# Patient Record
Sex: Female | Born: 1990 | Race: White | Hispanic: No | Smoking: Former smoker
Health system: Southern US, Community
[De-identification: ages and names within clinical notes are randomized; demographics above are authoritative.]

## PROBLEM LIST (undated history)

## (undated) DIAGNOSIS — F419 Anxiety disorder, unspecified: Secondary | ICD-10-CM

## (undated) DIAGNOSIS — Z789 Other specified health status: Secondary | ICD-10-CM

## (undated) DIAGNOSIS — F99 Mental disorder, not otherwise specified: Secondary | ICD-10-CM

## (undated) DIAGNOSIS — Z3490 Encounter for supervision of normal pregnancy, unspecified, unspecified trimester: Principal | ICD-10-CM

## (undated) DIAGNOSIS — O219 Vomiting of pregnancy, unspecified: Secondary | ICD-10-CM

## (undated) HISTORY — PX: WISDOM TOOTH EXTRACTION: SHX21

## (undated) HISTORY — DX: Vomiting of pregnancy, unspecified: O21.9

## (undated) HISTORY — DX: Mental disorder, not otherwise specified: F99

## (undated) HISTORY — DX: Encounter for supervision of normal pregnancy, unspecified, unspecified trimester: Z34.90

---

## 2009-08-27 ENCOUNTER — Ambulatory Visit (HOSPITAL_COMMUNITY): Admission: RE | Admit: 2009-08-27 | Discharge: 2009-08-27 | Payer: Self-pay | Admitting: Obstetrics & Gynecology

## 2010-03-16 ENCOUNTER — Inpatient Hospital Stay (HOSPITAL_COMMUNITY): Admission: AD | Admit: 2010-03-16 | Discharge: 2010-03-16 | Payer: Self-pay | Admitting: Obstetrics and Gynecology

## 2010-03-16 ENCOUNTER — Ambulatory Visit: Payer: Self-pay | Admitting: Obstetrics and Gynecology

## 2010-03-18 ENCOUNTER — Ambulatory Visit: Payer: Self-pay | Admitting: Obstetrics and Gynecology

## 2010-07-15 ENCOUNTER — Inpatient Hospital Stay (HOSPITAL_COMMUNITY): Admission: AD | Admit: 2010-07-15 | Discharge: 2010-03-20 | Payer: Self-pay | Admitting: Obstetrics & Gynecology

## 2010-10-22 LAB — CBC
HCT: 37.6 % (ref 36.0–46.0)
MCV: 87.7 fL (ref 78.0–100.0)
RDW: 14.1 % (ref 11.5–15.5)
WBC: 10.8 10*3/uL — ABNORMAL HIGH (ref 4.0–10.5)

## 2010-10-22 LAB — RPR: RPR Ser Ql: NONREACTIVE

## 2011-01-24 ENCOUNTER — Other Ambulatory Visit: Payer: Self-pay | Admitting: Adult Health

## 2011-01-24 ENCOUNTER — Other Ambulatory Visit (HOSPITAL_COMMUNITY)
Admission: RE | Admit: 2011-01-24 | Discharge: 2011-01-24 | Disposition: A | Discharge status: Home / Self Care | Payer: Medicaid Other | Source: Ambulatory Visit | Attending: Obstetrics and Gynecology | Admitting: Obstetrics and Gynecology

## 2011-01-24 DIAGNOSIS — Z01419 Encounter for gynecological examination (general) (routine) without abnormal findings: Secondary | ICD-10-CM | POA: Insufficient documentation

## 2011-01-24 DIAGNOSIS — Z113 Encounter for screening for infections with a predominantly sexual mode of transmission: Secondary | ICD-10-CM | POA: Insufficient documentation

## 2012-02-20 ENCOUNTER — Other Ambulatory Visit (HOSPITAL_COMMUNITY)
Admission: RE | Admit: 2012-02-20 | Discharge: 2012-02-20 | Disposition: A | Discharge status: Home / Self Care | Payer: Medicaid Other | Source: Ambulatory Visit | Attending: Obstetrics and Gynecology | Admitting: Obstetrics and Gynecology

## 2012-02-20 ENCOUNTER — Other Ambulatory Visit: Payer: Self-pay | Admitting: Obstetrics and Gynecology

## 2012-02-20 DIAGNOSIS — Z01419 Encounter for gynecological examination (general) (routine) without abnormal findings: Secondary | ICD-10-CM | POA: Insufficient documentation

## 2014-08-08 NOTE — L&D Delivery Note (Cosign Needed)
Delivery Note At 10:03 AM a viable female was delivered via Vaginal, Spontaneous Delivery (Presentation: Left Occiput Anterior).  APGAR: 9, 9; weight 8 lb 7.3 oz (3835 g).   Placenta status: Intact, Expressed.  Cord: 3 vessels with the following complications: None.  Anesthesia: None  Episiotomy: None Lacerations: None Est. Blood Loss (mL): 100  Mom to postpartum.  Baby to Couplet care / Skin to Skin.  Lowanda Foster 03/04/2015, 11:55 AM

## 2014-08-18 ENCOUNTER — Encounter: Payer: Self-pay | Admitting: Adult Health

## 2014-08-18 ENCOUNTER — Ambulatory Visit (INDEPENDENT_AMBULATORY_CARE_PROVIDER_SITE_OTHER): Payer: BLUE CROSS/BLUE SHIELD | Admitting: Adult Health

## 2014-08-18 ENCOUNTER — Telehealth: Payer: Self-pay | Admitting: Adult Health

## 2014-08-18 VITALS — BP 120/68 | Ht 66.0 in | Wt 168.5 lb

## 2014-08-18 DIAGNOSIS — O219 Vomiting of pregnancy, unspecified: Secondary | ICD-10-CM | POA: Insufficient documentation

## 2014-08-18 DIAGNOSIS — Z3490 Encounter for supervision of normal pregnancy, unspecified, unspecified trimester: Secondary | ICD-10-CM

## 2014-08-18 DIAGNOSIS — Z3201 Encounter for pregnancy test, result positive: Secondary | ICD-10-CM

## 2014-08-18 HISTORY — DX: Vomiting of pregnancy, unspecified: O21.9

## 2014-08-18 HISTORY — DX: Encounter for supervision of normal pregnancy, unspecified, unspecified trimester: Z34.90

## 2014-08-18 LAB — POCT URINE PREGNANCY: Preg Test, Ur: POSITIVE

## 2014-08-18 MED ORDER — PROMETHAZINE HCL 25 MG PO TABS: 25.0000 mg | ORAL_TABLET | Freq: Four times a day (QID) | ORAL | Status: DC | PRN

## 2014-08-18 MED ORDER — DOXYLAMINE-PYRIDOXINE 10-10 MG PO TBEC: DELAYED_RELEASE_TABLET | ORAL | Status: DC

## 2014-08-18 NOTE — Progress Notes (Signed)
Subjective:     Patient ID: Delphia GratesShelby D Celia, female   DOB: 08/11/1990, 24 y.o.   MRN: [id number]  HPI Mitzi DavenportShelby is a [age] year old white female in for UPT.Has N/V.  Review of Systems See HPI Reviewed past medical,surgical, social and family history. Reviewed medications and allergies.     Objective:   Physical Exam BP 120/68 mmHg  Ht 5\' 6"  (1.676 m)  Wt 168 lb 8 oz (76.431 kg)  BMI 27.21 kg/m2  LMP 11/02/2015UPT +, about [redacted] weeks pregnant with EDD 03/17/15, eat every 2 hours, will try diclegis    Assessment:     Pregnant +UPT Nausea and vomiting    Plan:     Rx diclegis #100 with 1 refill, take as directed Return 1/21 for dating US Review handout on first trimester and N/V and diclegis

## 2014-08-18 NOTE — Patient Instructions (Signed)
Morning Sickness Morning sickness is when you feel sick to your stomach (nauseous) during pregnancy. This nauseous feeling may or may not come with vomiting. It often occurs in the morning but can be a problem any time of day. Morning sickness is most common during the first trimester, but it may continue throughout pregnancy. While morning sickness is unpleasant, it is usually harmless unless you develop severe and continual vomiting (hyperemesis gravidarum). This condition requires more intense treatment.  CAUSES  The cause of morning sickness is not completely known but seems to be related to normal hormonal changes that occur in pregnancy. RISK FACTORS You are at greater risk if you: Experienced nausea or vomiting before your pregnancy. Had morning sickness during a previous pregnancy. Are pregnant with more than one baby, such as twins. TREATMENT  Do not use any medicines (prescription, over-the-counter, or herbal) for morning sickness without first talking to your health care provider. Your health care provider may prescribe or recommend: Vitamin B6 supplements. Anti-nausea medicines. The herbal medicine ginger. HOME CARE INSTRUCTIONS  Only take over-the-counter or prescription medicines as directed by your health care provider. Taking multivitamins before getting pregnant can prevent or decrease the severity of morning sickness in most women. Eat a piece of dry toast or unsalted crackers before getting out of bed in the morning. Eat five or six small meals a day. Eat dry and bland foods (rice, baked potato). Foods high in carbohydrates are often helpful. Do not drink liquids with your meals. Drink liquids between meals. Avoid greasy, fatty, and spicy foods. Get someone to cook for you if the smell of any food causes nausea and vomiting. If you feel nauseous after taking prenatal vitamins, take the vitamins at night or with a snack. Snack on protein foods (nuts, yogurt, cheese) between  meals if you are hungry. Eat unsweetened gelatins for desserts. Wearing an acupressure wristband (worn for sea sickness) may be helpful. Acupuncture may be helpful. Do not smoke. Get a humidifier to keep the air in your house free of odors. Get plenty of fresh air. SEEK MEDICAL CARE IF:  Your home remedies are not working, and you need medicine. You feel dizzy or lightheaded. You are losing weight. SEEK IMMEDIATE MEDICAL CARE IF:  You have persistent and uncontrolled nausea and vomiting. You pass out (faint). MAKE SURE YOU: Understand these instructions. Will watch your condition. Will get help right away if you are not doing well or get worse. Document Released: 09/15/2006 Document Revised: 07/30/2013 Document Reviewed: 01/09/2013 [facility] Patient Information 2015 [place], [place]. This information is not intended to replace advice given to you by your health care provider. Make sure you discuss any questions you have with your health care provider. First Trimester of Pregnancy The first trimester of pregnancy is from week 1 until the end of week 12 (months 1 through 3). A week after a sperm fertilizes an egg, the egg will implant on the wall of the uterus. This embryo will begin to develop into a baby. Genes from you and your partner are forming the baby. The female genes determine whether the baby is a boy or a girl. At 6-8 weeks, the eyes and face are formed, and the heartbeat can be seen on ultrasound. At the end of 12 weeks, all the baby's organs are formed.  Now that you are pregnant, you will want to do everything you can to have a healthy baby. Two of the most important things are to get good prenatal care and to  follow your health care provider's instructions. Prenatal care is all the medical care you receive before the baby's birth. This care will help prevent, find, and treat any problems during the pregnancy and childbirth. BODY CHANGES Your body goes through many changes during  pregnancy. The changes vary from woman to woman.   You may gain or lose a couple of pounds at first.  You may feel sick to your stomach (nauseous) and throw up (vomit). If the vomiting is uncontrollable, call your health care provider.  You may tire easily.  You may develop headaches that can be relieved by medicines approved by your health care provider.  You may urinate more often. Painful urination may mean you have a bladder infection.  You may develop heartburn as a result of your pregnancy.  You may develop constipation because certain hormones are causing the muscles that push waste through your intestines to slow down.  You may develop hemorrhoids or swollen, bulging veins (varicose veins).  Your breasts may begin to grow larger and become tender. Your nipples may stick out more, and the tissue that surrounds them (areola) may become darker.  Your gums may bleed and may be sensitive to brushing and flossing.  Dark spots or blotches (chloasma, mask of pregnancy) may develop on your face. This will likely fade after the baby is born.  Your menstrual periods will stop.  You may have a loss of appetite.  You may develop cravings for certain kinds of food.  You may have changes in your emotions from day to day, such as being excited to be pregnant or being concerned that something may go wrong with the pregnancy and baby.  You may have more vivid and strange dreams.  You may have changes in your hair. These can include thickening of your hair, rapid growth, and changes in texture. Some women also have hair loss during or after pregnancy, or hair that feels dry or thin. Your hair will most likely return to normal after your baby is born. WHAT TO EXPECT AT YOUR PRENATAL VISITS During a routine prenatal visit:  You will be weighed to make sure you and the baby are growing normally.  Your blood pressure will be taken.  Your abdomen will be measured to track your baby's  growth.  The fetal heartbeat will be listened to starting around week 10 or 12 of your pregnancy.  Test results from any previous visits will be discussed. Your health care provider may ask you:  How you are feeling.  If you are feeling the baby move.  If you have had any abnormal symptoms, such as leaking fluid, bleeding, severe headaches, or abdominal cramping.  If you have any questions. Other tests that may be performed during your first trimester include:  Blood tests to find your blood type and to check for the presence of any previous infections. They will also be used to check for low iron levels (anemia) and Rh antibodies. Later in the pregnancy, blood tests for diabetes will be done along with other tests if problems develop.  Urine tests to check for infections, diabetes, or protein in the urine.  An ultrasound to confirm the proper growth and development of the baby.  An amniocentesis to check for possible genetic problems.  Fetal screens for spina bifida and Down syndrome.  You may need other tests to make sure you and the baby are doing well. HOME CARE INSTRUCTIONS  Medicines  Follow your health care provider's instructions regarding medicine  use. Specific medicines may be either safe or unsafe to take during pregnancy.  Take your prenatal vitamins as directed.  If you develop constipation, try taking a stool softener if your health care provider approves. Diet  Eat regular, well-balanced meals. Choose a variety of foods, such as meat or vegetable-based protein, fish, milk and low-fat dairy products, vegetables, fruits, and whole grain breads and cereals. Your health care provider will help you determine the amount of weight gain that is right for you.  Avoid raw meat and uncooked cheese. These carry germs that can cause birth defects in the baby.  Eating four or five small meals rather than three large meals a day may help relieve nausea and vomiting. If you  start to feel nauseous, eating a few soda crackers can be helpful. Drinking liquids between meals instead of during meals also seems to help nausea and vomiting.  If you develop constipation, eat more high-fiber foods, such as fresh vegetables or fruit and whole grains. Drink enough fluids to keep your urine clear or pale yellow. Activity and Exercise  Exercise only as directed by your health care provider. Exercising will help you:  Control your weight.  Stay in shape.  Be prepared for labor and delivery.  Experiencing pain or cramping in the lower abdomen or low back is a good sign that you should stop exercising. Check with your health care provider before continuing normal exercises.  Try to avoid standing for long periods of time. Move your legs often if you must stand in one place for a long time.  Avoid heavy lifting.  Wear low-heeled shoes, and practice good posture.  You may continue to have sex unless your health care provider directs you otherwise. Relief of Pain or Discomfort  Wear a good support bra for breast tenderness.   Take warm sitz baths to soothe any pain or discomfort caused by hemorrhoids. Use hemorrhoid cream if your health care provider approves.   Rest with your legs elevated if you have leg cramps or low back pain.  If you develop varicose veins in your legs, wear support hose. Elevate your feet for 15 minutes, 3-4 times a day. Limit salt in your diet. Prenatal Care  Schedule your prenatal visits by the twelfth week of pregnancy. They are usually scheduled monthly at first, then more often in the last 2 months before delivery.  Write down your questions. Take them to your prenatal visits.  Keep all your prenatal visits as directed by your health care provider. Safety  Wear your seat belt at all times when driving.  Make a list of emergency phone numbers, including numbers for family, friends, the hospital, and police and fire  departments. General Tips  Ask your health care provider for a referral to a local prenatal education class. Begin classes no later than at the beginning of month 6 of your pregnancy.  Ask for help if you have counseling or nutritional needs during pregnancy. Your health care provider can offer advice or refer you to specialists for help with various needs.  Do not use hot tubs, steam rooms, or saunas.  Do not douche or use tampons or scented sanitary pads.  Do not cross your legs for long periods of time.  Avoid cat litter boxes and soil used by cats. These carry germs that can cause birth defects in the baby and possibly loss of the fetus by miscarriage or stillbirth.  Avoid all smoking, herbs, alcohol, and medicines not prescribed by your  health care provider. Chemicals in these affect the formation and growth of the baby.  Schedule a dentist appointment. At home, brush your teeth with a soft toothbrush and be gentle when you floss. SEEK MEDICAL CARE IF:   You have dizziness.  You have mild pelvic cramps, pelvic pressure, or nagging pain in the abdominal area.  You have persistent nausea, vomiting, or diarrhea.  You have a bad smelling vaginal discharge.  You have pain with urination.  You notice increased swelling in your face, hands, legs, or ankles. SEEK IMMEDIATE MEDICAL CARE IF:   You have a fever.  You are leaking fluid from your vagina.  You have spotting or bleeding from your vagina.  You have severe abdominal cramping or pain.  You have rapid weight gain or loss.  You vomit blood or material that looks like coffee grounds.  You are exposed to [place] measles and have never had them.  You are exposed to fifth disease or chickenpox.  You develop a severe headache.  You have shortness of breath.  You have any kind of trauma, such as from a fall or a car accident. Document Released: 07/19/2001 Document Revised: 12/09/2013 Document Reviewed:  06/04/2013 [facility] Patient Information 2015 [place], [place]. This information is not intended to replace advice given to you by your health care provider. Make sure you discuss any questions you have with your health care provider. Return 1/21 for dating [place]

## 2014-08-18 NOTE — Telephone Encounter (Signed)
Spoke with pt. Pt was seen this am and prescribed Diclegis. Pt states it's out of her price range. She is requesting Phenergan tabs. Thanks!! JSY

## 2014-08-18 NOTE — Telephone Encounter (Signed)
Pt requests phenergan will Rx

## 2014-08-28 ENCOUNTER — Ambulatory Visit (INDEPENDENT_AMBULATORY_CARE_PROVIDER_SITE_OTHER): Payer: BLUE CROSS/BLUE SHIELD

## 2014-08-28 DIAGNOSIS — O3680X0 Pregnancy with inconclusive fetal viability, not applicable or unspecified: Secondary | ICD-10-CM

## 2014-08-28 DIAGNOSIS — Z3490 Encounter for supervision of normal pregnancy, unspecified, unspecified trimester: Secondary | ICD-10-CM

## 2014-08-28 NOTE — Progress Notes (Signed)
Early OB ultrasound performed today.  Single, active IUP with FHR 158 bpm.  CRL measures 7.45 cm (13+[redacted] weeks GA) with a EDD of 03-02-15.  Right ovary is within normal limits.  Left ovary not visualized today. Cervix appears closed.

## 2014-09-11 ENCOUNTER — Ambulatory Visit (INDEPENDENT_AMBULATORY_CARE_PROVIDER_SITE_OTHER): Payer: BLUE CROSS/BLUE SHIELD | Admitting: Advanced Practice Midwife

## 2014-09-11 ENCOUNTER — Other Ambulatory Visit (HOSPITAL_COMMUNITY)
Admission: RE | Admit: 2014-09-11 | Discharge: 2014-09-11 | Disposition: A | Discharge status: Home / Self Care | Payer: BLUE CROSS/BLUE SHIELD | Source: Ambulatory Visit | Attending: Advanced Practice Midwife | Admitting: Advanced Practice Midwife

## 2014-09-11 ENCOUNTER — Encounter: Payer: Self-pay | Admitting: Advanced Practice Midwife

## 2014-09-11 VITALS — BP 112/58 | Wt 170.0 lb

## 2014-09-11 DIAGNOSIS — Z114 Encounter for screening for human immunodeficiency virus [HIV]: Secondary | ICD-10-CM

## 2014-09-11 DIAGNOSIS — Z3482 Encounter for supervision of other normal pregnancy, second trimester: Secondary | ICD-10-CM

## 2014-09-11 DIAGNOSIS — Z124 Encounter for screening for malignant neoplasm of cervix: Secondary | ICD-10-CM

## 2014-09-11 DIAGNOSIS — Z363 Encounter for antenatal screening for malformations: Secondary | ICD-10-CM

## 2014-09-11 DIAGNOSIS — Z113 Encounter for screening for infections with a predominantly sexual mode of transmission: Secondary | ICD-10-CM

## 2014-09-11 DIAGNOSIS — Z1389 Encounter for screening for other disorder: Secondary | ICD-10-CM

## 2014-09-11 DIAGNOSIS — Z3490 Encounter for supervision of normal pregnancy, unspecified, unspecified trimester: Secondary | ICD-10-CM

## 2014-09-11 DIAGNOSIS — Z01419 Encounter for gynecological examination (general) (routine) without abnormal findings: Secondary | ICD-10-CM | POA: Diagnosis present

## 2014-09-11 DIAGNOSIS — Z331 Pregnant state, incidental: Secondary | ICD-10-CM

## 2014-09-11 DIAGNOSIS — Z0283 Encounter for blood-alcohol and blood-drug test: Secondary | ICD-10-CM

## 2014-09-11 DIAGNOSIS — Z1371 Encounter for nonprocreative screening for genetic disease carrier status: Secondary | ICD-10-CM

## 2014-09-11 DIAGNOSIS — Z0184 Encounter for antibody response examination: Secondary | ICD-10-CM

## 2014-09-11 LAB — POCT URINALYSIS DIPSTICK
GLUCOSE UA: NEGATIVE
Ketones, UA: NEGATIVE
LEUKOCYTES UA: NEGATIVE
NITRITE UA: NEGATIVE
RBC UA: NEGATIVE

## 2014-09-11 MED ORDER — BUTALBITAL-APAP-CAFFEINE 50-325-40 MG PO TABS: 1.0000 | ORAL_TABLET | Freq: Four times a day (QID) | ORAL | Status: DC | PRN

## 2014-09-11 NOTE — Progress Notes (Signed)
  Subjective:    Emily Hatfield is a [id number] 4360w3d being seen today for her first obstetrical visit.  Her obstetrical history is significant for nothing.  Pregnancy history fully reviewed.  Patient reports nausea, bloated and some gas.  Declines meds.  Had a nosebleed Saturday.    Filed Vitals:   09/11/14 1356  BP: 112/58  Weight: 170 lb (77.111 kg)    HISTORY: OB History  Gravida Para Term Preterm AB SAB TAB Ectopic Multiple Living  3 1 1  1 1    1     # Outcome Date GA Lbr Len/2nd Weight Sex Delivery Anes PTL Lv  3 Current           2 Term 03/18/10 206w0d  7 lb 11 oz (3.487 kg) F Vag-Spont   Y  1 SAB              Past Medical History  Diagnosis Date  . Pregnant 08/18/2014  . Nausea and vomiting during pregnancy prior to [redacted] weeks gestation 08/18/2014   No past surgical history on file. Family History  Problem Relation Age of Onset  . Stroke Paternal Grandmother   . Heart disease Maternal Grandmother   . Cancer Maternal Grandfather     lung  . Hypertension Mother   . Diabetes Mother     borderline     Exam       Pelvic Exam:    Perineum: Normal Perineum   Vulva: normal   Vagina:  normal mucosa, normal discharge, no palpable nodules   Uterus Normal, Gravid, FH: 15     Cervix: normal   Adnexa: Not palpable   Urinary:  urethral meatus normal    System: Breast:  normal appearance, no masses or tenderness   Skin: normal coloration and turgor, no rashes    Neurologic: oriented, normal, normal mood   Extremities: normal strength, tone, and muscle mass   HEENT PERRLA   Mouth/Teeth mucous membranes moist, normal dentition   Neck supple and no masses   Cardiovascular: regular rate and rhythm   Respiratory:  appears well, vitals normal, no respiratory distress, acyanotic   Abdomen: soft, non-tender;  FHR: 150          Assessment:    Pregnancy: [id number] Patient Active Problem List   Diagnosis Date Noted  . Pregnant 08/18/2014  . Nausea and vomiting during  pregnancy prior to [redacted] weeks gestation 08/18/2014        Plan:     Initial labs drawn. Continue prenatal vitamins  Problem list reviewed and updated  Saline spray for nosebleed Reviewed n/v relief measures and warning s/s to report  Reviewed recommended weight gain based on pre-gravid BMI  Encouraged well-balanced diet Genetic Screening discussed Quad Screen: declined.  Ultrasound discussed; fetal survey: requested.  Follow up in 4 weeks for anatomy scan.  CRESENZO-DISHMAN,Teresina Bugaj 09/11/2014

## 2014-09-13 LAB — URINE CULTURE: ORGANISM ID, BACTERIA: NO GROWTH

## 2014-09-16 LAB — RUBELLA SCREEN: Rubella Antibodies, IGG: 2.98 index (ref >0.99)

## 2014-09-16 LAB — CBC
HCT: 37.9 % (ref 34.0–46.6)
HEMOGLOBIN: 12.8 g/dL (ref 11.1–15.9)
MCH: 29.4 pg (ref 26.6–33.0)
MCHC: 33.8 g/dL (ref 31.5–35.7)
MCV: 87 fL (ref 79–97)
Platelets: 223 10*3/uL (ref 150–379)
RBC: 4.35 x10E6/uL (ref 3.77–5.28)
RDW: 13.9 % (ref 12.3–15.4)
WBC: 8.4 10*3/uL (ref 3.4–10.8)

## 2014-09-16 LAB — URINALYSIS, ROUTINE W REFLEX MICROSCOPIC
Bilirubin, UA: NEGATIVE
GLUCOSE, UA: NEGATIVE
LEUKOCYTES UA: NEGATIVE
Nitrite, UA: NEGATIVE
RBC UA: NEGATIVE
SPEC GRAV UA: 1.027 (ref 1.005–1.030)
Urobilinogen, Ur: 1 mg/dL (ref 0.2–1.0)
pH, UA: 6.5 (ref 5.0–7.5)

## 2014-09-16 LAB — PMP SCREEN PROFILE (10S), URINE
Amphetamine Screen, Ur: NEGATIVE ng/mL
BARBITURATE SCRN UR: NEGATIVE ng/mL
BENZODIAZEPINE SCREEN, URINE: NEGATIVE ng/mL
CANNABINOIDS UR QL SCN: NEGATIVE ng/mL
CREATININE(CRT), U: 196.6 mg/dL (ref 20.0–300.0)
Cocaine(Metab.)Screen, Urine: NEGATIVE ng/mL
Methadone Scn, Ur: NEGATIVE ng/mL
OPIATE SCRN UR: NEGATIVE ng/mL
OXYCODONE+OXYMORPHONE UR QL SCN: NEGATIVE ng/mL
PCP SCRN UR: NEGATIVE ng/mL
PH UR, DRUG SCRN: 6.2 (ref 4.5–8.9)
PROPOXYPHENE SCREEN: NEGATIVE ng/mL

## 2014-09-16 LAB — ANTIBODY SCREEN: ANTIBODY SCREEN: NEGATIVE

## 2014-09-16 LAB — CYTOLOGY - PAP

## 2014-09-16 LAB — CYSTIC FIBROSIS MUTATION 97: GENE DIS ANAL CARRIER INTERP BLD/T-IMP: NOT DETECTED

## 2014-09-16 LAB — RPR: RPR: NONREACTIVE

## 2014-09-16 LAB — HIV ANTIBODY (ROUTINE TESTING W REFLEX): HIV Screen 4th Generation wRfx: NONREACTIVE

## 2014-09-16 LAB — VARICELLA ZOSTER ANTIBODY, IGG: VARICELLA: 1098 {index} (ref >165)

## 2014-09-16 LAB — ABO/RH: Rh Factor: POSITIVE

## 2014-09-16 LAB — HEPATITIS B SURFACE ANTIGEN: Hepatitis B Surface Ag: NEGATIVE

## 2014-10-02 ENCOUNTER — Ambulatory Visit (INDEPENDENT_AMBULATORY_CARE_PROVIDER_SITE_OTHER): Payer: BLUE CROSS/BLUE SHIELD | Admitting: Advanced Practice Midwife

## 2014-10-02 ENCOUNTER — Encounter: Payer: Self-pay | Admitting: Advanced Practice Midwife

## 2014-10-02 VITALS — BP 120/72 | HR 60 | Wt 171.0 lb

## 2014-10-02 DIAGNOSIS — Z1389 Encounter for screening for other disorder: Secondary | ICD-10-CM

## 2014-10-02 DIAGNOSIS — Z331 Pregnant state, incidental: Secondary | ICD-10-CM

## 2014-10-02 DIAGNOSIS — Z3482 Encounter for supervision of other normal pregnancy, second trimester: Secondary | ICD-10-CM

## 2014-10-02 LAB — POCT URINALYSIS DIPSTICK
GLUCOSE UA: NEGATIVE
Ketones, UA: NEGATIVE
Leukocytes, UA: NEGATIVE
NITRITE UA: NEGATIVE
Protein, UA: NEGATIVE
RBC UA: NEGATIVE

## 2014-10-02 NOTE — Progress Notes (Signed)
Pt states that she is having "stretching" pain.

## 2014-10-02 NOTE — Progress Notes (Signed)
[id number] 9835w3d Estimated Date of Delivery: 03/02/15  Blood pressure 120/72, pulse 60, weight 171 lb (77.565 kg), last menstrual period 06/09/2014.   BP weight and urine results all reviewed and noted.  Please refer to the obstetrical flow sheet for the fundal height and fetal heart rate documentation:  Patient reports good fetal movement, denies any bleeding and no rupture of membranes symptoms or regular contractions. Patient is without complaints. All questions were answered.  Plan:  Continued routine obstetrical care,   Follow up in 1 weeks for OB appointment, anatomy scan (scheduling conflict at work)

## 2014-10-09 ENCOUNTER — Ambulatory Visit (INDEPENDENT_AMBULATORY_CARE_PROVIDER_SITE_OTHER): Payer: BLUE CROSS/BLUE SHIELD | Admitting: Advanced Practice Midwife

## 2014-10-09 ENCOUNTER — Ambulatory Visit (INDEPENDENT_AMBULATORY_CARE_PROVIDER_SITE_OTHER): Payer: BLUE CROSS/BLUE SHIELD

## 2014-10-09 ENCOUNTER — Other Ambulatory Visit: Payer: Self-pay | Admitting: Advanced Practice Midwife

## 2014-10-09 VITALS — BP 120/76 | HR 68 | Wt 172.5 lb

## 2014-10-09 DIAGNOSIS — Z36 Encounter for antenatal screening of mother: Secondary | ICD-10-CM | POA: Diagnosis not present

## 2014-10-09 DIAGNOSIS — Z363 Encounter for antenatal screening for malformations: Secondary | ICD-10-CM

## 2014-10-09 DIAGNOSIS — Z1389 Encounter for screening for other disorder: Secondary | ICD-10-CM

## 2014-10-09 DIAGNOSIS — Z3490 Encounter for supervision of normal pregnancy, unspecified, unspecified trimester: Secondary | ICD-10-CM

## 2014-10-09 DIAGNOSIS — Z3492 Encounter for supervision of normal pregnancy, unspecified, second trimester: Secondary | ICD-10-CM

## 2014-10-09 DIAGNOSIS — Z331 Pregnant state, incidental: Secondary | ICD-10-CM

## 2014-10-09 NOTE — Progress Notes (Signed)
[id number] 5241w3d Estimated Date of Delivery: 03/02/15  Last menstrual period 06/09/2014.   BP weight and urine results all reviewed and noted.  Please refer to the obstetrical flow sheet for the fundal height and fetal heart rate documentation: anatomy scan today:  See report (all normal)  Patient reports good fetal movement, denies any bleeding and no rupture of membranes symptoms or regular contractions. Patient is without complaints. All questions were answered.  Plan:  Continued routine obstetrical care,   Follow up in 4 weeks for OB appointment,

## 2014-10-09 NOTE — Progress Notes (Signed)
Anatomic Survey US completed today.  Single, active, female fetus in a cephalic presentation. FHR 140 bpm.  Posterior Gr 0 placenta. Cervix is closed and measures 4.0cm.  Amniotic fluid is normal with a SVP of 5.1cm.  Bilateral ovaries are within normal limits. All anatomy visualized and appears normal.  EFW today of 274g which is consistent with dating.

## 2014-10-21 ENCOUNTER — Encounter: Payer: BLUE CROSS/BLUE SHIELD | Admitting: Obstetrics & Gynecology

## 2014-10-21 ENCOUNTER — Encounter: Payer: Self-pay | Admitting: Obstetrics & Gynecology

## 2014-10-22 NOTE — Progress Notes (Signed)
This encounter was created in error - please disregard.

## 2014-10-27 ENCOUNTER — Encounter: Payer: Self-pay | Admitting: Obstetrics & Gynecology

## 2014-10-27 ENCOUNTER — Ambulatory Visit (INDEPENDENT_AMBULATORY_CARE_PROVIDER_SITE_OTHER): Payer: BLUE CROSS/BLUE SHIELD | Admitting: Obstetrics & Gynecology

## 2014-10-27 VITALS — BP 120/70 | HR 68 | Wt 173.0 lb

## 2014-10-27 DIAGNOSIS — Z3492 Encounter for supervision of normal pregnancy, unspecified, second trimester: Secondary | ICD-10-CM

## 2014-10-27 DIAGNOSIS — Z1389 Encounter for screening for other disorder: Secondary | ICD-10-CM

## 2014-10-27 LAB — POCT URINALYSIS DIPSTICK
GLUCOSE UA: NEGATIVE
Ketones, UA: NEGATIVE
NITRITE UA: NEGATIVE
Protein, UA: NEGATIVE
RBC UA: NEGATIVE

## 2014-10-27 NOTE — Progress Notes (Signed)
[id number] 936w0d Estimated Date of Delivery: 03/02/15  Blood pressure 120/70, pulse 68, weight 173 lb (78.472 kg), last menstrual period 06/09/2014.   BP weight and urine results all reviewed and noted.  Please refer to the obstetrical flow sheet for the fundal height and fetal heart rate documentation:  Patient reports good fetal movement, denies any bleeding and no rupture of membranes symptoms or regular contractions. Patient is without complaints. All questions were answered.  Plan:  Continued routine obstetrical care,   Follow up in 4 weeks for OB appointment, PN2

## 2014-10-27 NOTE — Addendum Note (Signed)
Addended by: Debby BudLONG, Saphire Barnhart R on: 10/27/2014 10:41 AM   Modules accepted: Orders

## 2014-11-03 ENCOUNTER — Encounter: Payer: BLUE CROSS/BLUE SHIELD | Admitting: Women's Health

## 2014-11-24 ENCOUNTER — Encounter: Payer: Self-pay | Admitting: Women's Health

## 2014-11-24 ENCOUNTER — Ambulatory Visit (INDEPENDENT_AMBULATORY_CARE_PROVIDER_SITE_OTHER): Payer: BLUE CROSS/BLUE SHIELD | Admitting: Women's Health

## 2014-11-24 ENCOUNTER — Other Ambulatory Visit: Payer: BLUE CROSS/BLUE SHIELD

## 2014-11-24 VITALS — BP 118/60 | HR 68 | Wt 176.0 lb

## 2014-11-24 DIAGNOSIS — Z369 Encounter for antenatal screening, unspecified: Secondary | ICD-10-CM

## 2014-11-24 DIAGNOSIS — Z3492 Encounter for supervision of normal pregnancy, unspecified, second trimester: Secondary | ICD-10-CM

## 2014-11-24 DIAGNOSIS — Z131 Encounter for screening for diabetes mellitus: Secondary | ICD-10-CM

## 2014-11-24 DIAGNOSIS — Z1389 Encounter for screening for other disorder: Secondary | ICD-10-CM

## 2014-11-24 DIAGNOSIS — Z331 Pregnant state, incidental: Secondary | ICD-10-CM

## 2014-11-24 LAB — POCT URINALYSIS DIPSTICK
Blood, UA: NEGATIVE
GLUCOSE UA: NEGATIVE
KETONES UA: NEGATIVE
LEUKOCYTES UA: NEGATIVE
Nitrite, UA: NEGATIVE
Protein, UA: NEGATIVE

## 2014-11-24 NOTE — Progress Notes (Signed)
Low-risk OB appointment G3P1011 2656w0d Estimated Date of Delivery: 03/02/15 BP 118/60 mmHg  Pulse 68  Wt 176 lb (79.833 kg)  LMP 06/09/2014  BP, weight, and urine reviewed.  Refer to obstetrical flow sheet for FH & FHR.  Reports good fm.  Denies regular uc's, lof, vb, or uti s/s. Mild cold, getting better. Thinking about btl, discussed permanency and high incidence of regret at young age- to think about it some more.  Reviewed ptl s/s, fkc. Plan:  Continue routine obstetrical care  F/U in 4wks for OB appointment  PN2 today

## 2014-11-24 NOTE — Patient Instructions (Signed)

## 2014-11-25 LAB — CBC
HEMATOCRIT: 37.7 % (ref 34.0–46.6)
HEMOGLOBIN: 12.7 g/dL (ref 11.1–15.9)
MCH: 29.3 pg (ref 26.6–33.0)
MCHC: 33.7 g/dL (ref 31.5–35.7)
MCV: 87 fL (ref 79–97)
PLATELETS: 197 10*3/uL (ref 150–379)
RBC: 4.33 x10E6/uL (ref 3.77–5.28)
RDW: 13.6 % (ref 12.3–15.4)
WBC: 10.2 10*3/uL (ref 3.4–10.8)

## 2014-11-25 LAB — GLUCOSE TOLERANCE, 2 HOURS W/ 1HR
GLUCOSE, 1 HOUR: 117 mg/dL (ref 65–179)
GLUCOSE, 2 HOUR: 92 mg/dL (ref 65–152)
GLUCOSE, FASTING: 69 mg/dL (ref 65–91)

## 2014-11-25 LAB — ANTIBODY SCREEN: Antibody Screen: NEGATIVE

## 2014-11-25 LAB — HSV 2 ANTIBODY, IGG: HSV 2 Glycoprotein G Ab, IgG: <0.91 index (ref 0.00–0.90)

## 2014-11-25 LAB — RPR: RPR: NONREACTIVE

## 2014-11-25 LAB — HIV ANTIBODY (ROUTINE TESTING W REFLEX): HIV Screen 4th Generation wRfx: NONREACTIVE

## 2014-12-22 ENCOUNTER — Encounter: Payer: Self-pay | Admitting: Women's Health

## 2014-12-22 ENCOUNTER — Ambulatory Visit (INDEPENDENT_AMBULATORY_CARE_PROVIDER_SITE_OTHER): Payer: BLUE CROSS/BLUE SHIELD | Admitting: Women's Health

## 2014-12-22 VITALS — BP 118/70 | HR 64 | Wt 185.0 lb

## 2014-12-22 DIAGNOSIS — Z1389 Encounter for screening for other disorder: Secondary | ICD-10-CM

## 2014-12-22 DIAGNOSIS — Z331 Pregnant state, incidental: Secondary | ICD-10-CM

## 2014-12-22 DIAGNOSIS — Z3493 Encounter for supervision of normal pregnancy, unspecified, third trimester: Secondary | ICD-10-CM

## 2014-12-22 LAB — POCT URINALYSIS DIPSTICK
GLUCOSE UA: NEGATIVE
Ketones, UA: NEGATIVE
Leukocytes, UA: NEGATIVE
Nitrite, UA: NEGATIVE
Protein, UA: NEGATIVE
RBC UA: NEGATIVE

## 2014-12-22 NOTE — Progress Notes (Signed)
Low-risk OB appointment G3P1011 10535w0d Estimated Date of Delivery: 03/02/15 BP 118/70 mmHg  Pulse 64  Wt 185 lb (83.915 kg)  LMP 06/09/2014  BP, weight, and urine reviewed.  Refer to obstetrical flow sheet for FH & FHR.  Reports good fm.  Denies regular uc's, lof, vb, or uti s/s. Thinking about nexplanon- discussed r/b, gave pamphlet. Has tingling spot in Rt upper thigh, happens few times throughout the day- sounds like nerve pains.  Reviewed pn2 results, ptl s/s, fkc. Plan:  Continue routine obstetrical care  F/U in 2wks for OB appointment

## 2014-12-22 NOTE — Patient Instructions (Signed)
Call the office ([phone]) or go to [facility] if:  You begin to have strong, frequent contractions  Your water breaks.  Sometimes it is a big gush of fluid, sometimes it is just a trickle that keeps getting your panties wet or running down your legs  You have vaginal bleeding.  It is normal to have a small amount of spotting if your cervix was checked.   You don't feel your baby moving like normal.  If you don't, get you something to eat and drink and lay down and focus on feeling your baby move.  You should feel at least 10 movements in 2 hours.  If you don't, you should call the office or go to [facility].    Preterm Labor Information Preterm labor is when labor starts at less than 37 weeks of pregnancy. The normal length of a pregnancy is 39 to 41 weeks. CAUSES Often, there is no identifiable underlying cause as to why a woman goes into preterm labor. One of the most common known causes of preterm labor is infection. Infections of the uterus, cervix, vagina, amniotic sac, bladder, kidney, or even the lungs (pneumonia) can cause labor to start. Other suspected causes of preterm labor include:   Urogenital infections, such as yeast infections and bacterial vaginosis.   Uterine abnormalities (uterine shape, uterine septum, fibroids, or bleeding from the placenta).   A cervix that has been operated on (it may fail to stay closed).   Malformations in the fetus.   Multiple gestations (twins, triplets, and so on).   Breakage of the amniotic sac.  RISK FACTORS  Having a previous history of preterm labor.   Having premature rupture of membranes (PROM).   Having a placenta that covers the opening of the cervix (placenta previa).   Having a placenta that separates from the uterus (placental abruption).   Having a cervix that is too weak to hold the fetus in the uterus (incompetent cervix).   Having too much fluid in the amniotic sac (polyhydramnios).   Taking  illegal drugs or smoking while pregnant.   Not gaining enough weight while pregnant.   Being younger than 18 and older than 25 years old.   Having a low socioeconomic status.   Being African American. SYMPTOMS Signs and symptoms of preterm labor include:   Menstrual-like cramps, abdominal pain, or back pain.  Uterine contractions that are regular, as frequent as six in an hour, regardless of their intensity (may be mild or painful).  Contractions that start on the top of the uterus and spread down to the lower abdomen and back.   A sense of increased pelvic pressure.   A watery or bloody mucus discharge that comes from the vagina.  TREATMENT Depending on the length of the pregnancy and other circumstances, your health care provider may suggest bed rest. If necessary, there are medicines that can be given to stop contractions and to mature the fetal lungs. If labor happens before 34 weeks of pregnancy, a prolonged hospital stay may be recommended. Treatment depends on the condition of both you and the fetus.  WHAT SHOULD YOU DO IF YOU THINK YOU ARE IN PRETERM LABOR? Call your health care provider right away. You will need to go to the hospital to get checked immediately. HOW CAN YOU PREVENT PRETERM LABOR IN FUTURE PREGNANCIES? You should:   Stop smoking if you smoke.  Maintain healthy weight gain and avoid chemicals and drugs that are not necessary.  Be watchful for   any type of infection.  Inform your health care provider if you have a known history of preterm labor. Document Released: 10/15/2003 Document Revised: 03/27/2013 Document Reviewed: 08/27/2012 [employer] Patient Information 2015 [employer]. This information is not intended to replace advice given to you by your health care provider. Make sure you discuss any questions you have with your health care provider.  

## 2015-01-06 ENCOUNTER — Encounter: Payer: Self-pay | Admitting: Advanced Practice Midwife

## 2015-01-06 ENCOUNTER — Ambulatory Visit (INDEPENDENT_AMBULATORY_CARE_PROVIDER_SITE_OTHER): Payer: BLUE CROSS/BLUE SHIELD | Admitting: Advanced Practice Midwife

## 2015-01-06 VITALS — BP 110/72 | HR 68 | Wt 186.0 lb

## 2015-01-06 DIAGNOSIS — Z3492 Encounter for supervision of normal pregnancy, unspecified, second trimester: Secondary | ICD-10-CM

## 2015-01-06 DIAGNOSIS — I8390 Asymptomatic varicose veins of unspecified lower extremity: Secondary | ICD-10-CM | POA: Insufficient documentation

## 2015-01-06 DIAGNOSIS — Z1389 Encounter for screening for other disorder: Secondary | ICD-10-CM

## 2015-01-06 DIAGNOSIS — Z331 Pregnant state, incidental: Secondary | ICD-10-CM

## 2015-01-06 LAB — POCT URINALYSIS DIPSTICK
GLUCOSE UA: NEGATIVE
Ketones, UA: NEGATIVE
Leukocytes, UA: NEGATIVE
NITRITE UA: NEGATIVE
Protein, UA: NEGATIVE
RBC UA: NEGATIVE

## 2015-01-06 NOTE — Progress Notes (Signed)
[id number] 4323w1d Estimated Date of Delivery: 03/02/15  Blood pressure 110/72, pulse 68, weight 186 lb (84.369 kg), last menstrual period 06/09/2014.   BP weight and urine results all reviewed and noted.  Please refer to the obstetrical flow sheet for the fundal height and fetal heart rate documentation:  Patient reports good fetal movement, denies any bleeding and no rupture of membranes symptoms or regular contractions. Patient is without complaints. All questions were answered.  Plan:  Continued routine obstetrical care,   Follow up in 2 weeks for OB appointment,

## 2015-01-06 NOTE — Progress Notes (Signed)
Pt states that she right foot hurts all the time, bottom of her heel.

## 2015-01-08 DIAGNOSIS — Z029 Encounter for administrative examinations, unspecified: Secondary | ICD-10-CM

## 2015-01-19 ENCOUNTER — Ambulatory Visit (INDEPENDENT_AMBULATORY_CARE_PROVIDER_SITE_OTHER): Payer: BLUE CROSS/BLUE SHIELD | Admitting: Women's Health

## 2015-01-19 VITALS — BP 102/60 | HR 75 | Wt 188.0 lb

## 2015-01-19 DIAGNOSIS — Z3493 Encounter for supervision of normal pregnancy, unspecified, third trimester: Secondary | ICD-10-CM

## 2015-01-19 DIAGNOSIS — Z331 Pregnant state, incidental: Secondary | ICD-10-CM

## 2015-01-19 DIAGNOSIS — O26843 Uterine size-date discrepancy, third trimester: Secondary | ICD-10-CM

## 2015-01-19 DIAGNOSIS — Z1389 Encounter for screening for other disorder: Secondary | ICD-10-CM

## 2015-01-19 LAB — POCT URINALYSIS DIPSTICK
Blood, UA: NEGATIVE
GLUCOSE UA: NEGATIVE
Ketones, UA: NEGATIVE
LEUKOCYTES UA: NEGATIVE
Nitrite, UA: NEGATIVE
Protein, UA: NEGATIVE

## 2015-01-19 NOTE — Patient Instructions (Addendum)
Tips to Help Leg Cramps  Increase dietary sources of calcium (milk, yogurt, cheese, leafy greens, seafood, legumes, and fruit) and magnesium (dark leafy greens, nuts, seeds, fish, beans, whole grains, avocados, yogurt, bananas, dried fruit, dark chocolate)  Spoonful of regular yellow mustard every night  Magnesium supplement: in the morning, at night (can find in the vitamin aisle)  Dorsiflexion of foot: pointing your toes back towards your knee during the cramp      [place] Pediatricians/Family Doctors:  [facility] [phone]            [facility] [phone]                 [facility] [phone] (usually not accepting new patients unless you have family there already, you are always welcome to call and ask)            [facility] ([address] [place]) [phone]   [facility] Pediatricians/Family Doctors:   [facility]: [phone]  [facility]: [phone]   Call the office [phone]) or go to [facility] if:  You begin to have strong, frequent contractions  Your water breaks.  Sometimes it is a big gush of fluid, sometimes it is just a trickle that keeps getting your panties wet or running down your legs  You have vaginal bleeding.  It is normal to have a small amount of spotting if your cervix was checked.   You don't feel your baby moving like normal.  If you don't, get you something to eat and drink and lay down and focus on feeling your baby move.  You should feel at least 10 movements in 2 hours.  If you don't, you should call the office or go to [facility].    Preterm Labor Information Preterm labor is when labor starts at less than 37 weeks of pregnancy. The normal length of a pregnancy is 39 to 41 weeks. CAUSES Often, there is no identifiable underlying cause as to why a woman goes into preterm labor. One of the most common known causes of  preterm labor is infection. Infections of the uterus, cervix, vagina, amniotic sac, bladder, kidney, or even the lungs (pneumonia) can cause labor to start. Other suspected causes of preterm labor include:  7. Urogenital infections, such as yeast infections and bacterial vaginosis.  8. Uterine abnormalities (uterine shape, uterine septum, fibroids, or bleeding from the placenta).  9. A cervix that has been operated on (it may fail to stay closed).  10. Malformations in the fetus.  11. Multiple gestations (twins, triplets, and so on).  12. Breakage of the amniotic sac.  RISK FACTORS 2. Having a previous history of preterm labor.  3. Having premature rupture of membranes (PROM).  4. Having a placenta that covers the opening of the cervix (placenta previa).  5. Having a placenta that separates from the uterus (placental abruption).  6. Having a cervix that is too weak to hold the fetus in the uterus (incompetent cervix).  7. Having too much fluid in the amniotic sac (polyhydramnios).  8. Taking illegal drugs or smoking while pregnant.  9. Not gaining enough weight while pregnant.  10. Being younger than 66 and older than 25 years old.  11. Having a low socioeconomic status.  12. Being African American. SYMPTOMS Signs and symptoms of preterm labor include:  5. Menstrual-like cramps, abdominal pain, or back pain. 6. Uterine contractions that are regular, as frequent as six in an hour, regardless  of their intensity (may be mild or painful). 7. Contractions that start on the top of the uterus and spread down to the lower abdomen and back.  8. A sense of increased pelvic pressure.  9. A watery or bloody mucus discharge that comes from the vagina.  TREATMENT Depending on the length of the pregnancy and other circumstances, your health care provider may suggest bed rest. If necessary, there are medicines that can be given to stop contractions and to mature the fetal lungs. If  labor happens before 34 weeks of pregnancy, a prolonged hospital stay may be recommended. Treatment depends on the condition of both you and the fetus.  WHAT SHOULD YOU DO IF YOU THINK YOU ARE IN PRETERM LABOR? Call your health care provider right away. You will need to go to the hospital to get checked immediately. HOW CAN YOU PREVENT PRETERM LABOR IN FUTURE PREGNANCIES? You should:  2. Stop smoking if you smoke. 3. Maintain healthy weight gain and avoid chemicals and drugs that are not necessary. 4. Be watchful for any type of infection. 5. Inform your health care provider if you have a known history of preterm labor. Document Released: 10/15/2003 Document Revised: 03/27/2013 Document Reviewed: 08/27/2012 [employer] Patient Information 2015 [place], [place]. This information is not intended to replace advice given to you by your health care provider. Make sure you discuss any questions you have with your health care provider.

## 2015-01-19 NOTE — Progress Notes (Signed)
Low-risk OB appointment G3P1011 [redacted]w[redacted]d Estimated Date of Delivery: 03/02/15 BP 102/60 mmHg  Pulse 75  Wt 188 lb (85.276 kg)  LMP 06/09/2014  BP, weight, and urine reviewed.  Refer to obstetrical flow sheet for FH & FHR.  Reports good fm.  Denies regular uc's, lof, vb, or uti s/s. No complaints.  Reviewed ptl s/s, fkc. Plan:  Continue routine obstetrical care  F/U in asap for growth/afi u/s (no visit), FH 32cm today, measured at 34 last visit and 32 visit before. Pt feels like this baby is smaller than her last. If all normal f/u 2wks for visit

## 2015-01-22 ENCOUNTER — Ambulatory Visit (INDEPENDENT_AMBULATORY_CARE_PROVIDER_SITE_OTHER): Payer: BLUE CROSS/BLUE SHIELD

## 2015-01-22 DIAGNOSIS — O26843 Uterine size-date discrepancy, third trimester: Secondary | ICD-10-CM | POA: Diagnosis not present

## 2015-01-22 DIAGNOSIS — I8390 Asymptomatic varicose veins of unspecified lower extremity: Secondary | ICD-10-CM

## 2015-01-22 DIAGNOSIS — Z3493 Encounter for supervision of normal pregnancy, unspecified, third trimester: Secondary | ICD-10-CM

## 2015-01-22 NOTE — Progress Notes (Signed)
[place] 34+3wks measurement c/w dates, efw 2577g 55.8%,post pl gr 2,normal ov's bilat,cephalic afi 14cm,fht 143bpm

## 2015-02-03 ENCOUNTER — Ambulatory Visit (INDEPENDENT_AMBULATORY_CARE_PROVIDER_SITE_OTHER): Payer: BLUE CROSS/BLUE SHIELD | Admitting: Women's Health

## 2015-02-03 ENCOUNTER — Encounter: Payer: Self-pay | Admitting: Women's Health

## 2015-02-03 VITALS — BP 100/70 | HR 68 | Wt 190.0 lb

## 2015-02-03 DIAGNOSIS — Z1389 Encounter for screening for other disorder: Secondary | ICD-10-CM

## 2015-02-03 DIAGNOSIS — Z331 Pregnant state, incidental: Secondary | ICD-10-CM

## 2015-02-03 DIAGNOSIS — Z3493 Encounter for supervision of normal pregnancy, unspecified, third trimester: Secondary | ICD-10-CM

## 2015-02-03 NOTE — Progress Notes (Signed)
Pt denies any problems or concerns at this time.  

## 2015-02-03 NOTE — Patient Instructions (Signed)
Call the office ([phone]) or go to [facility] if:  You begin to have strong, frequent contractions  Your water breaks.  Sometimes it is a big gush of fluid, sometimes it is just a trickle that keeps getting your panties wet or running down your legs  You have vaginal bleeding.  It is normal to have a small amount of spotting if your cervix was checked.   You don't feel your baby moving like normal.  If you don't, get you something to eat and drink and lay down and focus on feeling your baby move.  You should feel at least 10 movements in 2 hours.  If you don't, you should call the office or go to [facility].    Braxton Hicks Contractions Contractions of the uterus can occur throughout pregnancy. Contractions are not always a sign that you are in labor.  WHAT ARE BRAXTON HICKS CONTRACTIONS?  Contractions that occur before labor are called Braxton Hicks contractions, or false labor. Toward the end of pregnancy (32-34 weeks), these contractions can develop more often and may become more forceful. This is not true labor because these contractions do not result in opening (dilatation) and thinning of the cervix. They are sometimes difficult to tell apart from true labor because these contractions can be forceful and people have different pain tolerances. You should not feel embarrassed if you go to the hospital with false labor. Sometimes, the only way to tell if you are in true labor is for your health care provider to look for changes in the cervix. If there are no prenatal problems or other health problems associated with the pregnancy, it is completely safe to be sent home with false labor and await the onset of true labor. HOW CAN YOU TELL THE DIFFERENCE BETWEEN TRUE AND FALSE LABOR? False Labor  The contractions of false labor are usually shorter and not as hard as those of true labor.   The contractions are usually irregular.   The contractions are often felt in the front of  the lower abdomen and in the groin.   The contractions may go away when you walk around or change positions while lying down.   The contractions get weaker and are shorter lasting as time goes on.   The contractions do not usually become progressively stronger, regular, and closer together as with true labor.  True Labor  Contractions in true labor last 30-70 seconds, become very regular, usually become more intense, and increase in frequency.   The contractions do not go away with walking.   The discomfort is usually felt in the top of the uterus and spreads to the lower abdomen and low back.   True labor can be determined by your health care provider with an exam. This will show that the cervix is dilating and getting thinner.  WHAT TO REMEMBER  Keep up with your usual exercises and follow other instructions given by your health care provider.   Take medicines as directed by your health care provider.   Keep your regular prenatal appointments.   Eat and drink lightly if you think you are going into labor.   If Braxton Hicks contractions are making you uncomfortable:   Change your position from lying down or resting to walking, or from walking to resting.   Sit and rest in a tub of warm water.   Drink 2-3 glasses of water. Dehydration may cause these contractions.   Do slow and deep breathing several times an hour.    WHEN SHOULD I SEEK IMMEDIATE MEDICAL CARE? Seek immediate medical care if:  Your contractions become stronger, more regular, and closer together.   You have fluid leaking or gushing from your vagina.   You have a fever.   You pass blood-tinged mucus.   You have vaginal bleeding.   You have continuous abdominal pain.   You have low back pain that you never had before.   You feel your baby's head pushing down and causing pelvic pressure.   Your baby is not moving as much as it used to.  Document Released: 07/25/2005 Document  Revised: 07/30/2013 Document Reviewed: 05/06/2013 [employer] Patient Information 2015 [employer]. This information is not intended to replace advice given to you by your health care provider. Make sure you discuss any questions you have with your health care provider.  

## 2015-02-03 NOTE — Progress Notes (Signed)
Low-risk OB appointment G3P1011 3444w1d Estimated Date of Delivery: 03/02/15 BP 100/70 mmHg  Pulse 68  Wt 190 lb (86.183 kg)  LMP 06/09/2014  BP, weight, and urine reviewed.  Refer to obstetrical flow sheet for FH & FHR.  Reports good fm.  Denies regular uc's, lof, vb, or uti s/s. No complaints. EFW/AFI u/s @ 36.1wks for s<d normal, FH normal today Reviewed ptl s/s, fkc. Plan:  Continue routine obstetrical care  F/U in 1wk for OB appointment and gbs

## 2015-02-11 ENCOUNTER — Ambulatory Visit (INDEPENDENT_AMBULATORY_CARE_PROVIDER_SITE_OTHER): Payer: BLUE CROSS/BLUE SHIELD | Admitting: Obstetrics and Gynecology

## 2015-02-11 VITALS — BP 120/80 | HR 88 | Wt 193.0 lb

## 2015-02-11 DIAGNOSIS — Z1389 Encounter for screening for other disorder: Secondary | ICD-10-CM

## 2015-02-11 DIAGNOSIS — Z3493 Encounter for supervision of normal pregnancy, unspecified, third trimester: Secondary | ICD-10-CM

## 2015-02-11 DIAGNOSIS — Z369 Encounter for antenatal screening, unspecified: Secondary | ICD-10-CM

## 2015-02-11 DIAGNOSIS — Z331 Pregnant state, incidental: Secondary | ICD-10-CM

## 2015-02-11 LAB — POCT URINALYSIS DIPSTICK
Blood, UA: NEGATIVE
Glucose, UA: NEGATIVE
Ketones, UA: NEGATIVE
LEUKOCYTES UA: NEGATIVE
Nitrite, UA: NEGATIVE
PROTEIN UA: NEGATIVE

## 2015-02-11 NOTE — Progress Notes (Signed)
[id number] 7040w2d Estimated Date of Delivery: 03/02/15  Last menstrual period 06/09/2014.   refer to the ob flow sheet for FH and FHR, also BP, Wt, Urine results:notable for negative  Patient reports   good fetal movement, denies any bleeding and no rupture of membranes symptoms or regular contractions. Patient complaints:right groin discomfort worsening. No bleeding.  Questions were answered. Assessment: LROB requesting IOL at 39+5 for family support reasons. 2lan:  Continued routine obstetrical care, examine cx at 39 + and decide if IOL can be considered. Pt aware that cervical favorability/dilation is required to consider  F/u in 1 weeks for lrob

## 2015-02-11 NOTE — Progress Notes (Signed)
Pt denies any problems or concerns at this time. Pt wants to discuss being induced.

## 2015-02-13 LAB — GC/CHLAMYDIA PROBE AMP
CHLAMYDIA, DNA PROBE: NEGATIVE
NEISSERIA GONORRHOEAE BY PCR: NEGATIVE

## 2015-02-16 LAB — CULTURE, BETA STREP (GROUP B ONLY): STREP GP B CULTURE: NEGATIVE

## 2015-02-18 ENCOUNTER — Ambulatory Visit (INDEPENDENT_AMBULATORY_CARE_PROVIDER_SITE_OTHER): Payer: BLUE CROSS/BLUE SHIELD | Admitting: Women's Health

## 2015-02-18 VITALS — BP 122/70 | HR 82 | Wt 196.0 lb

## 2015-02-18 DIAGNOSIS — Z331 Pregnant state, incidental: Secondary | ICD-10-CM

## 2015-02-18 DIAGNOSIS — Z3493 Encounter for supervision of normal pregnancy, unspecified, third trimester: Secondary | ICD-10-CM

## 2015-02-18 DIAGNOSIS — Z1389 Encounter for screening for other disorder: Secondary | ICD-10-CM

## 2015-02-18 LAB — POCT URINALYSIS DIPSTICK
Blood, UA: NEGATIVE
Glucose, UA: NEGATIVE
Ketones, UA: NEGATIVE
LEUKOCYTES UA: NEGATIVE
NITRITE UA: NEGATIVE
Protein, UA: NEGATIVE

## 2015-02-18 NOTE — Progress Notes (Signed)
Low-risk OB appointment G3P1011 374w2d Estimated Date of Delivery: 03/02/15 BP 122/70 mmHg  Pulse 82  Wt 196 lb (88.905 kg)  LMP 06/09/2014  BP, weight, and urine reviewed.  Refer to obstetrical flow sheet for FH & FHR.  Reports good fm.  Denies regular uc's, lof, vb, or uti s/s. No complaints. SVE per request: 2/50/-2, vtx Reviewed labor s/s, fkc. Plan:  Continue routine obstetrical care  F/U in 1wk for OB appointment w/ JVF to determine induceability per her request

## 2015-02-18 NOTE — Patient Instructions (Signed)
Call the office ([phone]) or go to [facility] if:  You begin to have strong, frequent contractions  Your water breaks.  Sometimes it is a big gush of fluid, sometimes it is just a trickle that keeps getting your panties wet or running down your legs  You have vaginal bleeding.  It is normal to have a small amount of spotting if your cervix was checked.   You don't feel your baby moving like normal.  If you don't, get you something to eat and drink and lay down and focus on feeling your baby move.  You should feel at least 10 movements in 2 hours.  If you don't, you should call the office or go to [facility].    Braxton Hicks Contractions Contractions of the uterus can occur throughout pregnancy. Contractions are not always a sign that you are in labor.  WHAT ARE BRAXTON HICKS CONTRACTIONS?  Contractions that occur before labor are called Braxton Hicks contractions, or false labor. Toward the end of pregnancy (32-34 weeks), these contractions can develop more often and may become more forceful. This is not true labor because these contractions do not result in opening (dilatation) and thinning of the cervix. They are sometimes difficult to tell apart from true labor because these contractions can be forceful and people have different pain tolerances. You should not feel embarrassed if you go to the hospital with false labor. Sometimes, the only way to tell if you are in true labor is for your health care provider to look for changes in the cervix. If there are no prenatal problems or other health problems associated with the pregnancy, it is completely safe to be sent home with false labor and await the onset of true labor. HOW CAN YOU TELL THE DIFFERENCE BETWEEN TRUE AND FALSE LABOR? False Labor  The contractions of false labor are usually shorter and not as hard as those of true labor.   The contractions are usually irregular.   The contractions are often felt in the front of  the lower abdomen and in the groin.   The contractions may go away when you walk around or change positions while lying down.   The contractions get weaker and are shorter lasting as time goes on.   The contractions do not usually become progressively stronger, regular, and closer together as with true labor.  True Labor  Contractions in true labor last 30-70 seconds, become very regular, usually become more intense, and increase in frequency.   The contractions do not go away with walking.   The discomfort is usually felt in the top of the uterus and spreads to the lower abdomen and low back.   True labor can be determined by your health care provider with an exam. This will show that the cervix is dilating and getting thinner.  WHAT TO REMEMBER  Keep up with your usual exercises and follow other instructions given by your health care provider.   Take medicines as directed by your health care provider.   Keep your regular prenatal appointments.   Eat and drink lightly if you think you are going into labor.   If Braxton Hicks contractions are making you uncomfortable:   Change your position from lying down or resting to walking, or from walking to resting.   Sit and rest in a tub of warm water.   Drink 2-3 glasses of water. Dehydration may cause these contractions.   Do slow and deep breathing several times an hour.    WHEN SHOULD I SEEK IMMEDIATE MEDICAL CARE? Seek immediate medical care if:  Your contractions become stronger, more regular, and closer together.   You have fluid leaking or gushing from your vagina.   You have a fever.   You pass blood-tinged mucus.   You have vaginal bleeding.   You have continuous abdominal pain.   You have low back pain that you never had before.   You feel your baby's head pushing down and causing pelvic pressure.   Your baby is not moving as much as it used to.  Document Released: 07/25/2005 Document  Revised: 07/30/2013 Document Reviewed: 05/06/2013 [employer] Patient Information 2015 [employer]. This information is not intended to replace advice given to you by your health care provider. Make sure you discuss any questions you have with your health care provider.  

## 2015-02-25 ENCOUNTER — Encounter: Payer: Self-pay | Admitting: Obstetrics and Gynecology

## 2015-02-25 ENCOUNTER — Ambulatory Visit (INDEPENDENT_AMBULATORY_CARE_PROVIDER_SITE_OTHER): Payer: BLUE CROSS/BLUE SHIELD | Admitting: Obstetrics and Gynecology

## 2015-02-25 VITALS — BP 120/80 | HR 64 | Wt 196.5 lb

## 2015-02-25 DIAGNOSIS — Z3493 Encounter for supervision of normal pregnancy, unspecified, third trimester: Secondary | ICD-10-CM | POA: Diagnosis not present

## 2015-02-25 DIAGNOSIS — Z3492 Encounter for supervision of normal pregnancy, unspecified, second trimester: Secondary | ICD-10-CM

## 2015-02-25 DIAGNOSIS — Z1389 Encounter for screening for other disorder: Secondary | ICD-10-CM

## 2015-02-25 DIAGNOSIS — Z331 Pregnant state, incidental: Secondary | ICD-10-CM

## 2015-02-25 LAB — POCT URINALYSIS DIPSTICK
Blood, UA: NEGATIVE
Blood, UA: NEGATIVE
GLUCOSE UA: NEGATIVE
Glucose, UA: NEGATIVE
Ketones, UA: NEGATIVE
Ketones, UA: NEGATIVE
Leukocytes, UA: NEGATIVE
Leukocytes, UA: NEGATIVE
NITRITE UA: NEGATIVE
Nitrite, UA: NEGATIVE
PROTEIN UA: NEGATIVE
Protein, UA: NEGATIVE

## 2015-02-25 NOTE — Progress Notes (Signed)
Pt denies any problems or concerns at this time.  

## 2015-02-25 NOTE — Progress Notes (Signed)
Patient ID: Emily Hatfield, female   DOB: 01/01/1991, 24 y.o.   MRN: [id number] 6919w2d Estimated Date of Delivery: 03/02/15  Blood pressure 120/80, pulse 64, weight 196 lb 8 oz (89.132 kg), last menstrual period 06/09/2014.   refer to the ob flow sheet for FH and FHR, also BP, Wt, Urine results: normal  Patient reports good fetal movement, denies any bleeding and no rupture of membranes symptoms or regular contractions. Patient complaints : None FHR: 117 FH: 40cm Cervix: head is down and appropriate, posterior 1cm  Questions were answered. Assessment: Pregnancy 10019w2d, LROB   Patient desires weekend IOL due to family support Plan:  Continued routine obstetrical care  Delay IOL until 8040w4dn next weekend   This chart was scribed for Tilda BurrowJohn Tarin Johndrow V, MD by Ronney LionSuzanne Le, ED Scribe. This patient was seen in room 2, and the patient's care was started at 3:13 PM.  I personally performed the services described in this documentation, which was SCRIBED in my presence. The recorded information has been reviewed and considered accurate. It has been edited as necessary during review. Tilda BurrowFERGUSON,Viveca Beckstrom V, MD     .

## 2015-03-04 ENCOUNTER — Inpatient Hospital Stay (HOSPITAL_COMMUNITY)
Admission: AD | Admit: 2015-03-04 | Discharge: 2015-03-05 | DRG: 775 | Disposition: A | Discharge status: Home / Self Care | Payer: BLUE CROSS/BLUE SHIELD | Source: Ambulatory Visit | Attending: Obstetrics & Gynecology | Admitting: Obstetrics & Gynecology

## 2015-03-04 ENCOUNTER — Encounter: Payer: BLUE CROSS/BLUE SHIELD | Admitting: Obstetrics and Gynecology

## 2015-03-04 ENCOUNTER — Encounter (HOSPITAL_COMMUNITY): Payer: Self-pay | Admitting: *Deleted

## 2015-03-04 DIAGNOSIS — Z3490 Encounter for supervision of normal pregnancy, unspecified, unspecified trimester: Secondary | ICD-10-CM

## 2015-03-04 DIAGNOSIS — Z833 Family history of diabetes mellitus: Secondary | ICD-10-CM

## 2015-03-04 DIAGNOSIS — Z3A40 40 weeks gestation of pregnancy: Secondary | ICD-10-CM | POA: Diagnosis present

## 2015-03-04 DIAGNOSIS — Z823 Family history of stroke: Secondary | ICD-10-CM

## 2015-03-04 DIAGNOSIS — Z8249 Family history of ischemic heart disease and other diseases of the circulatory system: Secondary | ICD-10-CM

## 2015-03-04 DIAGNOSIS — IMO0001 Reserved for inherently not codable concepts without codable children: Secondary | ICD-10-CM

## 2015-03-04 HISTORY — DX: Other specified health status: Z78.9

## 2015-03-04 LAB — CBC
HCT: 38 % (ref 36.0–46.0)
HEMOGLOBIN: 12.6 g/dL (ref 12.0–15.0)
MCH: 29.4 pg (ref 26.0–34.0)
MCHC: 33.2 g/dL (ref 30.0–36.0)
MCV: 88.6 fL (ref 78.0–100.0)
Platelets: 146 10*3/uL — ABNORMAL LOW (ref 150–400)
RBC: 4.29 MIL/uL (ref 3.87–5.11)
RDW: 13.8 % (ref 11.5–15.5)
WBC: 9.9 10*3/uL (ref 4.0–10.5)

## 2015-03-04 LAB — TYPE AND SCREEN
ABO/RH(D): O POS
Antibody Screen: NEGATIVE

## 2015-03-04 LAB — RPR: RPR: NONREACTIVE

## 2015-03-04 LAB — ABO/RH: ABO/RH(D): O POS

## 2015-03-04 MED ORDER — PRENATAL MULTIVITAMIN CH
1.0000 | ORAL_TABLET | Freq: Every day | ORAL | Status: DC
  Administered 2015-03-05: 1 via ORAL
  Filled 2015-03-04 (×2): qty 1

## 2015-03-04 MED ORDER — WITCH HAZEL-GLYCERIN EX PADS: 1.0000 "application " | MEDICATED_PAD | CUTANEOUS | Status: DC | PRN

## 2015-03-04 MED ORDER — IBUPROFEN 600 MG PO TABS
600.0000 mg | ORAL_TABLET | Freq: Four times a day (QID) | ORAL | Status: DC
  Administered 2015-03-04 – 2015-03-05 (×5): 600 mg via ORAL
  Filled 2015-03-04 (×5): qty 1

## 2015-03-04 MED ORDER — ONDANSETRON HCL 4 MG/2ML IJ SOLN
4.0000 mg | Freq: Four times a day (QID) | INTRAMUSCULAR | Status: DC | PRN
Start: 2015-03-04 — End: 2015-03-05

## 2015-03-04 MED ORDER — PANTOPRAZOLE SODIUM 20 MG PO TBEC
20.0000 mg | DELAYED_RELEASE_TABLET | Freq: Every day | ORAL | Status: DC
  Administered 2015-03-04 – 2015-03-05 (×2): 20 mg via ORAL
  Filled 2015-03-04 (×3): qty 1

## 2015-03-04 MED ORDER — LIDOCAINE HCL (PF) 1 % IJ SOLN
30.0000 mL | INTRAMUSCULAR | Status: DC | PRN
  Filled 2015-03-04: qty 30

## 2015-03-04 MED ORDER — DOCUSATE SODIUM 100 MG PO CAPS
100.0000 mg | ORAL_CAPSULE | Freq: Two times a day (BID) | ORAL | Status: DC
  Administered 2015-03-05 (×2): 100 mg via ORAL
  Filled 2015-03-04 (×2): qty 1

## 2015-03-04 MED ORDER — DIBUCAINE 1 % RE OINT
1.0000 "application " | TOPICAL_OINTMENT | RECTAL | Status: DC | PRN
  Filled 2015-03-04: qty 28

## 2015-03-04 MED ORDER — ACETAMINOPHEN 325 MG PO TABS: 650.0000 mg | ORAL_TABLET | ORAL | Status: DC | PRN

## 2015-03-04 MED ORDER — OXYTOCIN BOLUS FROM INFUSION: 500.0000 mL | INTRAVENOUS | Status: DC

## 2015-03-04 MED ORDER — FENTANYL CITRATE (PF) 100 MCG/2ML IJ SOLN: 50.0000 ug | INTRAMUSCULAR | Status: DC | PRN

## 2015-03-04 MED ORDER — OXYTOCIN 40 UNITS IN LACTATED RINGERS INFUSION - SIMPLE MED: 62.5000 mL/h | INTRAVENOUS | Status: DC

## 2015-03-04 MED ORDER — BENZOCAINE-MENTHOL 20-0.5 % EX AERO
1.0000 "application " | INHALATION_SPRAY | CUTANEOUS | Status: DC | PRN
  Administered 2015-03-05: 1 via TOPICAL
  Filled 2015-03-04 (×2): qty 56

## 2015-03-04 MED ORDER — OXYTOCIN 40 UNITS IN LACTATED RINGERS INFUSION - SIMPLE MED
62.5000 mL/h | INTRAVENOUS | Status: DC
  Administered 2015-03-04: 62.5 mL/h via INTRAVENOUS

## 2015-03-04 MED ORDER — SIMETHICONE 80 MG PO CHEW
80.0000 mg | CHEWABLE_TABLET | ORAL | Status: DC | PRN
  Filled 2015-03-04: qty 1

## 2015-03-04 MED ORDER — OXYTOCIN 40 UNITS IN LACTATED RINGERS INFUSION - SIMPLE MED
62.5000 mL/h | INTRAVENOUS | Status: DC | PRN
  Administered 2015-03-04: 62.5 mL/h via INTRAVENOUS

## 2015-03-04 MED ORDER — CITRIC ACID-SODIUM CITRATE 334-500 MG/5ML PO SOLN: 30.0000 mL | ORAL | Status: DC | PRN

## 2015-03-04 MED ORDER — LACTATED RINGERS IV SOLN: 500.0000 mL | INTRAVENOUS | Status: DC | PRN

## 2015-03-04 MED ORDER — OXYCODONE-ACETAMINOPHEN 5-325 MG PO TABS: 2.0000 | ORAL_TABLET | ORAL | Status: DC | PRN

## 2015-03-04 MED ORDER — DIPHENHYDRAMINE HCL 25 MG PO CAPS: 25.0000 mg | ORAL_CAPSULE | Freq: Four times a day (QID) | ORAL | Status: DC | PRN

## 2015-03-04 MED ORDER — ONDANSETRON HCL 4 MG/2ML IJ SOLN: 4.0000 mg | INTRAMUSCULAR | Status: DC | PRN

## 2015-03-04 MED ORDER — LANOLIN HYDROUS EX OINT: TOPICAL_OINTMENT | CUTANEOUS | Status: DC | PRN

## 2015-03-04 MED ORDER — OXYTOCIN BOLUS FROM INFUSION
500.0000 mL | INTRAVENOUS | Status: DC
  Administered 2015-03-04: 500 mL via INTRAVENOUS

## 2015-03-04 MED ORDER — OXYCODONE-ACETAMINOPHEN 5-325 MG PO TABS: 1.0000 | ORAL_TABLET | ORAL | Status: DC | PRN

## 2015-03-04 MED ORDER — LACTATED RINGERS IV SOLN
INTRAVENOUS | Status: DC
  Administered 2015-03-04: 09:00:00 via INTRAVENOUS

## 2015-03-04 MED ORDER — ONDANSETRON HCL 4 MG PO TABS: 4.0000 mg | ORAL_TABLET | ORAL | Status: DC | PRN

## 2015-03-04 NOTE — H&P (Signed)
Emily Hatfield is a 24 y.o. female presenting for Contractions for 6-8 hrs. No LOF or VB. Reports uncomplicated pregnancy. Maternal Medical History:  Reason for admission: Nausea.    OB History    Gravida Para Term Preterm AB TAB SAB Ectopic Multiple Living   0 2     Past Medical History  Diagnosis Date  . Pregnant 08/18/2014  . Nausea and vomiting during pregnancy prior to [redacted] weeks gestation 08/18/2014  . Medical history non-contributory    Past Surgical History  Procedure Laterality Date  . Wisdom tooth extraction     Family History: family history includes Cancer in her maternal grandfather; Diabetes in her mother; Heart disease in her maternal grandmother; Hypertension in her mother; Stroke in her paternal grandmother. Social History:  reports that she has never smoked. She has never used smokeless tobacco. She reports that she does not drink alcohol or use illicit drugs.   [facility]  FOB [facility] [place], [place], 3rd baby, married  Dating By 1st trimester [place]  Pap Normal 2/16  GC/CT Initial:     -/-           36+wks:  Genetic Screen NT/IT/AFP: declined  CF screen negative  Anatomic [place] Normal female, 'Matthew'  Flu vaccine   Tdap Recommended ~ 28wks  Glucose Screen  2 hr normal 69/117/92  GBS   Feed Preference Breast and bottle  Contraception nexplanon  Circumcision Yes at FT  Childbirth Classes declined  Pediatrician Undecided, info given      Prenatal Transfer Tool  Maternal Diabetes: No Genetic Screening: Normal Maternal Ultrasounds/Referrals: Normal Fetal Ultrasounds or other Referrals:  None Maternal Substance Abuse:  No Significant Maternal Medications:  None Significant Maternal Lab Results:  None Other Comments:  None  Review of Systems  Constitutional: Negative for fever.  Respiratory: Negative for shortness of breath.   Cardiovascular: Negative for chest pain and leg swelling.  Gastrointestinal: Positive for abdominal pain. Negative  for heartburn, nausea and vomiting.  Genitourinary: Negative for urgency and frequency.    Dilation: 8 Effacement (%): 100 Station: -1 Exam by:: K.Wilson,RN Blood pressure 115/55, pulse 63, temperature 97.8 F (36.6 C), temperature source Oral, resp. rate 18, height 5' 5.5" (1.664 m), weight 88.724 kg (195 lb 9.6 oz), last menstrual period 06/09/2014, unknown if currently breastfeeding. Maternal Exam:  Uterine Assessment: Contraction strength is moderate.  Contraction duration is 30 seconds. Contraction frequency is regular.   Abdomen: Patient reports generalized tenderness.  Introitus: Normal vulva. Normal vagina.  Ferning test: not done.  Nitrazine test: not done. Amniotic fluid character: clear.  Pelvis: adequate for delivery.   Cervix: Cervix evaluated by digital exam.     Physical Exam  Constitutional: She is oriented to person, place, and time. She appears well-developed and well-nourished.  HENT:  Head: Normocephalic and atraumatic.  Eyes: Conjunctivae and EOM are normal.  Neck: Normal range of motion.  Cardiovascular: Intact distal pulses.   Respiratory: Effort normal. No respiratory distress.  GI: Soft. There is generalized tenderness.  Genitourinary: Vagina normal and uterus normal.  Musculoskeletal: Normal range of motion.  Neurological: She is alert and oriented to person, place, and time.  Skin: Skin is warm and dry.  Psychiatric: She has a normal mood and affect. Her behavior is normal. Judgment and thought content normal.    Prenatal labs: ABO, Rh: --/--/O POS (07/27 0840) Antibody: NEG (07/27 0840) Rubella:   RPR: Non Reactive (04/18 0908)  HBsAg: Negative (02/04 1445)  HIV:    GBS:     Assessment/Plan: Pt seen in MAU and already at 9/100. Precipitous delivery ensued. No complications during pregnancy and GBS neg; expecting normal SVD.  #standard orders placed #delivery in MAU  Lowanda Foster, MD 03/04/2015, 11:49 AM

## 2015-03-04 NOTE — MAU Note (Signed)
Legs in stirrups 0913 Pushing 0920

## 2015-03-04 NOTE — Lactation Note (Signed)
This note was copied from the chart of Emily Hatfield. Lactation Consultation Note Experienced BF mom for 10 months to her 25 yr old daughter. Denied difficulty. Denied painful latches with her daughter, but states this baby hurts when latches. Assessed baby's mouth and noted good mobility of heart shaped tongue, upper lip labial frenulum. Mom has good everted nipples. No bruising noted, states are getting tender from baby clamping on her nipple. Mom has been BF in football position. Assisted in opening wider flange, encouraged to keep baby close during BF. Mom hand expressed easy flow of colostrum.  Mom encouraged to feed baby 8-12 times/24 hours and with feeding cues. Referred to Baby and Me Book in Breastfeeding section Pg. 22-23 for position options and Proper latch demonstration. Educated about newborn behavior, cluster feeding, I&O, supply and demand. WH/LC brochure given w/resources, support groups and LC services. Patient Name: Emily Hatfield [id number] Date: 03/04/2015 Reason for consult: Initial assessment   Maternal Data Has patient been taught Hand Expression?: Yes Does the patient have breastfeeding experience prior to this delivery?: Yes  Feeding Feeding Type: Breast Fed Length of feed: 10 min (still BF)  LATCH Score/Interventions Latch: Grasps breast easily, tongue down, lips flanged, rhythmical sucking. Intervention(s): Adjust position;Assist with latch;Breast massage;Breast compression  Audible Swallowing: Spontaneous and intermittent Intervention(s): Skin to skin;Hand expression;Alternate breast massage  Type of Nipple: Everted at rest and after stimulation  Comfort (Breast/Nipple): Filling, red/small blisters or bruises, mild/mod discomfort  Problem noted: Mild/Moderate discomfort (no redness or bruising noted) Interventions (Mild/moderate discomfort): Hand massage;Hand expression  Hold (Positioning): Assistance needed to correctly position infant at breast and  maintain latch. Intervention(s): Breastfeeding basics reviewed;Support Pillows;Position options;Skin to skin  LATCH Score: 8  Lactation Tools Discussed/Used     Consult Status Consult Status: Follow-up Date: 03/05/15 (in pm) Follow-up type: In-patient    [name] 03/04/2015, 10:19 PM

## 2015-03-04 NOTE — MAU Note (Signed)
Pt reports ctx q 10 min all night and got stronger and closer about 530 this morning.  Reports some leaking and bloody show. Good fetal  movement reported

## 2015-03-05 MED ORDER — IBUPROFEN 600 MG PO TABS: 600.0000 mg | ORAL_TABLET | Freq: Four times a day (QID) | ORAL | Status: DC | PRN

## 2015-03-05 NOTE — Lactation Note (Signed)
This note was copied from the chart of Emily Hatfield. Lactation Consultation Note: Experienced BF mom reports much pain with initial latch and nipples are raw. They are pink but intact. Comfort gels given with instructions for use. Mom reports pain level of 3-4 after initial latch. Baby does have curved tongue but is able to extend it past gumline. Mom reports it feels like he is biting. Does tuck bottom lip under- adjusted but mom reports it does not feel better. Mom states she will pump and bottle feed EBM at home if it continues to hurt this much, Encouraged to discuss with Pediatrician. Has manual pump from [employer], Does have insurance- encouraged to call them about a pump. No further questions at present. To call prn.  Patient Name: Emily Hatfield [id number] Date: 03/05/2015 Reason for consult: Follow-up assessment   Maternal Data Formula Feeding for Exclusion: No Does the patient have breastfeeding experience prior to this delivery?: Yes  Feeding   LATCH Score/Interventions Latch: Grasps breast easily, tongue down, lips flanged, rhythmical sucking. Intervention(s): Assist with latch;Adjust position  Audible Swallowing: A few with stimulation Intervention(s): Skin to skin;Hand expression  Type of Nipple: Everted at rest and after stimulation  Comfort (Breast/Nipple): Filling, red/small blisters or bruises, mild/mod discomfort  Problem noted: Mild/Moderate discomfort Interventions (Mild/moderate discomfort): Hand massage  Hold (Positioning): No assistance needed to correctly position infant at breast.  LATCH Score: 8  Lactation Tools Discussed/Used     Consult Status Consult Status: Complete    Pamelia Hoit 03/05/2015, 11:03 AM

## 2015-03-05 NOTE — Discharge Summary (Signed)
Obstetric Discharge Summary Reason for Admission: onset of labor Prenatal Procedures: none Intrapartum Procedures: spontaneous vaginal delivery Postpartum Procedures: none Complications-Operative and Postpartum: none  At 10:03 AM a viable female was delivered via Vaginal, Spontaneous Delivery (Presentation: Left Occiput Anterior). APGAR: 9, 9; weight 8 lb 7.3 oz (3835 g).  Placenta status: Intact, Expressed. Cord: 3 vessels with the following complications: None.  Anesthesia: None  Episiotomy: None Lacerations: None Est. Blood Loss (mL): 100  Hospital Course:  Active Problems:   Supervision of normal pregnancy   Active labor   Indication for care in labor or delivery   Active labor at term   Emily Hatfield is a 24 y.o. [id number] s/p SVD.  Patient was admitted 7/27 for SOL.  She has postpartum course that was uncomplicated including no problems with ambulating, PO intake, urination, pain, or bleeding. The pt feels ready to go home and  will be discharged with outpatient follow-up.   Today: No acute events overnight.  Pt denies problems with ambulating, voiding or po intake.  She denies nausea or vomiting.  Pain is well controlled.  She has had flatus. She has not had bowel movement.  Lochia Small.  Plan for birth control is  nexplanon.  Method of Feeding: Breast  Physical Exam:  General: alert and cooperative Lochia: appropriate Uterine Fundus: firm, below umbilicus DVT Evaluation: No evidence of DVT seen on physical exam.  H/H: Lab Results  Component Value Date/Time   HGB 12.6 03/04/2015 08:44 AM   HCT 38.0 03/04/2015 08:44 AM    Discharge Diagnoses: Term Pregnancy-delivered  Discharge Information: Date: 03/05/2015 Activity: pelvic rest Diet: routine  Medications: PNV and Ibuprofen Breast feeding:  Yes, but will most likely switch to bottle feeding  Condition: stable Instructions: refer to handout Discharge to: home      Medication List    STOP taking these  medications        acetaminophen 500 MG tablet  Commonly known as:  TYLENOL     calcium carbonate 500 MG chewable tablet  Commonly known as:  TUMS - dosed in mg elemental calcium      TAKE these medications        butalbital-acetaminophen-caffeine 50-325-40 MG per tablet  Commonly known as:  FIORICET, ESGIC  Take 1 tablet by mouth 2 (two) times daily as needed for headache.     ibuprofen 600 MG tablet  Commonly known as:  ADVIL,MOTRIN  Take 1 tablet (600 mg total) by mouth every 6 (six) hours as needed.           Follow-up Information    Follow up with [facility]. Schedule an appointment as soon as possible for a visit in 4 weeks.   Why:  For your postpartum appointment.   Contact information:   [address] [place] [address] [phone]      De Hollingshead ,MD OB Fellow 03/05/2015,7:52 AM   CNM attestation I have seen and examined this patient and agree with above documentation in the resident's note.   Emily Hatfield is a 25 y.o. [id number] s/p SVD.   Pain is well controlled.  Plan for birth control is Nexplanon.  Method of Feeding: breast  PE:  BP 106/53 mmHg  Pulse 78  Temp(Src) 98 F (36.7 C) (Oral)  Resp 19  Ht 5' 5.5" (1.664 m)  Wt 88.724 kg (195 lb 9.6 oz)  BMI 32.04 kg/m2  SpO2 99%  LMP 06/09/2014  Breastfeeding? Unknown Fundus firm  Recent Labs  03/04/15 0844  HGB 12.6  HCT 38.0     Plan: discharge today - postpartum care discussed - f/u clinic in 6 weeks for postpartum visit   Cam Hai, CNM 9:20 AM  03/05/2015

## 2015-03-05 NOTE — Discharge Instructions (Signed)

## 2015-04-20 ENCOUNTER — Encounter: Payer: Self-pay | Admitting: Women's Health

## 2015-04-20 ENCOUNTER — Ambulatory Visit (INDEPENDENT_AMBULATORY_CARE_PROVIDER_SITE_OTHER): Payer: BLUE CROSS/BLUE SHIELD | Admitting: Women's Health

## 2015-04-20 NOTE — Progress Notes (Signed)
Patient ID: Emily Hatfield, female   DOB: 1991/05/17, 24 y.o.   MRN: [id number] Subjective:    Emily Hatfield is a 24 y.o. G34P2012 Caucasian female who presents for a postpartum visit. She is 4 weeks postpartum following a spontaneous vaginal delivery at 40.2 gestational weeks, delivered precipitously in MAU. Anesthesia: none. I have fully reviewed the prenatal and intrapartum course. Postpartum course has been uncomplicated. Baby's course has been uncomplicated. Baby is feeding by bottle. Bleeding no bleeding. Bowel function is normal. Bladder function is normal. Patient is sexually active. Last sexual activity: 9/10. Contraception method is none and wants nexplanon. Postpartum depression screening: negative. Score 5.  Last pap Feb 2016 and was normal.  The following portions of the patient's history were reviewed and updated as appropriate: allergies, current medications, past medical history, past surgical history and problem list.  Review of Systems Pertinent items are noted in HPI.   Filed Vitals:   04/20/15 1013  BP: 98/60  Pulse: 68  Weight: 184 lb (83.462 kg)   No LMP recorded.  Objective:   General:  alert, cooperative and no distress   Breasts:  deferred, no complaints  Lungs: clear to auscultation bilaterally  Heart:  regular rate and rhythm  Abdomen: soft, nontender   Vulva: normal  Vagina: normal vagina  Cervix:  closed  Corpus: Well-involuted  Adnexa:  Non-palpable  Rectal Exam: No hemorrhoids        Assessment:   Postpartum exam 4 wks s/p SVB Bottlefeeding Depression screening Contraception counseling   Plan:   Contraception: abstinence until nexplanon Follow up in: 2 weeks for upt & nexplanon insertion- offered 10d from last sex and bhcg- but would rather do upt   Marge Duncans CNM, [facility] 04/20/2015 11:03 AM

## 2015-04-20 NOTE — Patient Instructions (Signed)
NO SEX UNTIL AFTER YOU GET YOUR BIRTH CONTROL  Etonogestrel implant What is this medicine? ETONOGESTREL (et oh noe JES trel) is a contraceptive (birth control) device. It is used to prevent pregnancy. It can be used for up to 3 years. This medicine may be used for other purposes; ask your health care provider or pharmacist if you have questions. COMMON BRAND NAME(S): Implanon, Nexplanon What should I tell my health care provider before I take this medicine? They need to know if you have any of these conditions: -abnormal vaginal bleeding -blood vessel disease or blood clots -cancer of the breast, cervix, or liver -depression -diabetes -gallbladder disease -headaches -heart disease or recent heart attack -high blood pressure -high cholesterol -kidney disease -liver disease -renal disease -seizures -tobacco smoker -an unusual or allergic reaction to etonogestrel, other hormones, anesthetics or antiseptics, medicines, foods, dyes, or preservatives -pregnant or trying to get pregnant -breast-feeding How should I use this medicine? This device is inserted just under the skin on the inner side of your upper arm by a health care professional. Talk to your pediatrician regarding the use of this medicine in children. Special care may be needed. Overdosage: If you think you've taken too much of this medicine contact a poison control center or emergency room at once. Overdosage: If you think you have taken too much of this medicine contact a poison control center or emergency room at once. NOTE: This medicine is only for you. Do not share this medicine with others. What if I miss a dose? This does not apply. What may interact with this medicine? Do not take this medicine with any of the following medications: -amprenavir -bosentan -fosamprenavir This medicine may also interact with the following medications: -barbiturate medicines for inducing sleep or treating seizures -certain  medicines for fungal infections like ketoconazole and itraconazole -griseofulvin -medicines to treat seizures like carbamazepine, felbamate, oxcarbazepine, phenytoin, topiramate -modafinil -phenylbutazone -rifampin -some medicines to treat HIV infection like atazanavir, indinavir, lopinavir, nelfinavir, tipranavir, ritonavir -St. John's wort This list may not describe all possible interactions. Give your health care provider a list of all the medicines, herbs, non-prescription drugs, or dietary supplements you use. Also tell them if you smoke, drink alcohol, or use illegal drugs. Some items may interact with your medicine. What should I watch for while using this medicine? This product does not protect you against HIV infection (AIDS) or other sexually transmitted diseases. You should be able to feel the implant by pressing your fingertips over the skin where it was inserted. Tell your doctor if you cannot feel the implant. What side effects may I notice from receiving this medicine? Side effects that you should report to your doctor or health care professional as soon as possible: -allergic reactions like skin rash, itching or hives, swelling of the face, lips, or tongue -breast lumps -changes in vision -confusion, trouble speaking or understanding -dark urine -depressed mood -general ill feeling or flu-like symptoms -light-colored stools -loss of appetite, nausea -right upper belly pain -severe headaches -severe pain, swelling, or tenderness in the abdomen -shortness of breath, chest pain, swelling in a leg -signs of pregnancy -sudden numbness or weakness of the face, arm or leg -trouble walking, dizziness, loss of balance or coordination -unusual vaginal bleeding, discharge -unusually weak or tired -yellowing of the eyes or skin Side effects that usually do not require medical attention (Report these to your doctor or health care professional if they continue or are  bothersome.): -acne -breast pain -changes in weight -cough -  fever or chills -headache -irregular menstrual bleeding -itching, burning, and vaginal discharge -pain or difficulty passing urine -sore throat This list may not describe all possible side effects. Call your doctor for medical advice about side effects. You may report side effects to FDA at 1-800-FDA-1088. Where should I keep my medicine? This drug is given in a hospital or clinic and will not be stored at home. NOTE: This sheet is a summary. It may not cover all possible information. If you have questions about this medicine, talk to your doctor, pharmacist, or health care provider.  2015, Elsevier/Gold Standard. (2012-01-30 15:37:45)  

## 2015-04-27 ENCOUNTER — Telehealth: Payer: Self-pay | Admitting: *Deleted

## 2015-04-27 NOTE — Telephone Encounter (Signed)
Pt requesting return to work note for tomorrow 04/28/2015. Pt delivered on 03/04/2015. Note left at front desk for pick up.

## 2015-05-04 ENCOUNTER — Encounter: Payer: BLUE CROSS/BLUE SHIELD | Admitting: Women's Health

## 2015-05-05 ENCOUNTER — Encounter: Payer: BLUE CROSS/BLUE SHIELD | Admitting: Women's Health

## 2015-05-06 ENCOUNTER — Encounter: Payer: Self-pay | Admitting: Advanced Practice Midwife

## 2015-05-06 ENCOUNTER — Ambulatory Visit (INDEPENDENT_AMBULATORY_CARE_PROVIDER_SITE_OTHER): Payer: BLUE CROSS/BLUE SHIELD | Admitting: Advanced Practice Midwife

## 2015-05-06 VITALS — BP 120/50 | HR 60 | Ht 66.0 in | Wt 186.0 lb

## 2015-05-06 DIAGNOSIS — Z3009 Encounter for other general counseling and advice on contraception: Secondary | ICD-10-CM

## 2015-05-06 DIAGNOSIS — Z30011 Encounter for initial prescription of contraceptive pills: Secondary | ICD-10-CM | POA: Diagnosis not present

## 2015-05-06 MED ORDER — NORGESTIMATE-ETH ESTRADIOL 0.25-35 MG-MCG PO TABS: 1.0000 | ORAL_TABLET | Freq: Every day | ORAL | Status: DC

## 2015-05-06 NOTE — Progress Notes (Signed)
   [facility] Visit  Patient name: Emily Hatfield MRN [id number]  Date of birth: [birth year]  CC & HPI:  Emily Hatfield is a 24 y.o. Caucasian female presenting today for birth control. She is 2.5 months postpartum, would like the Nexplanon, but her insurance only covers pills.  She has had unprotected sex and is aware that she could be pregnant.  LMP 1 month ago, pregnancy test today is negative.    Pertinent History Reviewed:  Medical & Surgical Hx:   Past Medical History  Diagnosis Date  . Pregnant 08/18/2014  . Nausea and vomiting during pregnancy prior to [redacted] weeks gestation 08/18/2014  . Medical history non-contributory    Past Surgical History  Procedure Laterality Date  . Wisdom tooth extraction     Family History  Problem Relation Age of Onset  . Stroke Paternal Grandmother   . Heart disease Maternal Grandmother   . Cancer Maternal Grandfather     lung  . Hypertension Mother   . Diabetes Mother     borderline    Current outpatient prescriptions:  .  butalbital-acetaminophen-caffeine (FIORICET, ESGIC) 50-325-40 MG per tablet, Take 1 tablet by mouth 2 (two) times daily as needed for headache., Disp: , Rfl:  .  ibuprofen (ADVIL,MOTRIN) 600 MG tablet, Take 1 tablet (600 mg total) by mouth every 6 (six) hours as needed., Disp: 30 tablet, Rfl: 0 .  norgestimate-ethinyl estradiol (ORTHO-CYCLEN,SPRINTEC,PREVIFEM) 0.25-35 MG-MCG tablet, Take 1 tablet by mouth daily., Disp: 1 Package, Rfl: 11 Social History: Reviewed -  reports that she has never smoked. She has never used smokeless tobacco.  Review of Systems:      Objective Findings:  Vitals: BP 120/50 mmHg  Pulse 60  Ht  (1.676 m)  Wt 186 lb (84.369 kg)  BMI 30.04 kg/m2  LMP 04/02/2015  Breastfeeding? No  Physical Examination: General appearance - alert, well appearing, and in no distress Mental status - alert, oriented to person, place, and time Musculoskeletal - full range of motion without  pain Extremities - no pedal edema noted  No results found for this or any previous visit (from the past 24 hour(s)).        Assessment & Plan:  A:   Contraception managment P:  Rx sprintec--start tomorrow, BU for 2 weeks. Take ajother pregnancy test in a few weeks   F/U with health department to see if they will provide low cost nexplanon   CRESENZO-DISHMAN,FRANCES CNM 05/06/2015 4:15 PM

## 2015-05-25 ENCOUNTER — Encounter: Payer: BLUE CROSS/BLUE SHIELD | Admitting: Women's Health

## 2015-05-25 ENCOUNTER — Telehealth: Payer: Self-pay | Admitting: Advanced Practice Midwife

## 2015-05-25 NOTE — Telephone Encounter (Signed)
Pt states she continues to have vaginal bleeding after starting the Sprintec on 05/07/2015. Please advise.

## 2015-05-26 NOTE — Telephone Encounter (Signed)
Pt informed BTB is common when starting a new birth control pill, take at the same time each day and do not miss any pills, if pt continues to have vaginal bleeding by the 4 th pack call our office back and will switch. Pt verbalized understanding.

## 2015-05-26 NOTE — Telephone Encounter (Signed)
I don't remember telling her to call if she bleeds, as this is a normal Side effect of all birth control for the first 3 months.  Advise her of this, and to take the pill at the same time and not miss any--that is all she can do to minimize this normal SE.  If she is still bleeing by the 4th pack (probably what I told her), THEN let me know and I will change her pill.

## 2016-04-25 ENCOUNTER — Other Ambulatory Visit: Payer: Self-pay | Admitting: Advanced Practice Midwife

## 2016-05-02 ENCOUNTER — Emergency Department (HOSPITAL_COMMUNITY)
Admission: EM | Admit: 2016-05-02 | Discharge: 2016-05-02 | Disposition: A | Discharge status: Home / Self Care | Payer: BLUE CROSS/BLUE SHIELD

## 2016-06-13 ENCOUNTER — Encounter (HOSPITAL_COMMUNITY): Payer: Self-pay | Admitting: Emergency Medicine

## 2016-06-13 DIAGNOSIS — R1011 Right upper quadrant pain: Secondary | ICD-10-CM | POA: Insufficient documentation

## 2016-06-13 DIAGNOSIS — Z79899 Other long term (current) drug therapy: Secondary | ICD-10-CM | POA: Insufficient documentation

## 2016-06-13 LAB — COMPREHENSIVE METABOLIC PANEL
ALT: 141 U/L — ABNORMAL HIGH (ref 14–54)
AST: 179 U/L — ABNORMAL HIGH (ref 15–41)
Albumin: 4 g/dL (ref 3.5–5.0)
Alkaline Phosphatase: 164 U/L — ABNORMAL HIGH (ref 38–126)
Anion gap: 9 (ref 5–15)
BUN: 8 mg/dL (ref 6–20)
CHLORIDE: 102 mmol/L (ref 101–111)
CO2: 24 mmol/L (ref 22–32)
CREATININE: 0.82 mg/dL (ref 0.44–1.00)
Calcium: 8.9 mg/dL (ref 8.9–10.3)
Glucose, Bld: 108 mg/dL — ABNORMAL HIGH (ref 65–99)
POTASSIUM: 3.5 mmol/L (ref 3.5–5.1)
Sodium: 135 mmol/L (ref 135–145)
Total Bilirubin: 2.4 mg/dL — ABNORMAL HIGH (ref 0.3–1.2)
Total Protein: 7.7 g/dL (ref 6.5–8.1)

## 2016-06-13 LAB — CBC
HEMATOCRIT: 41 % (ref 36.0–46.0)
Hemoglobin: 13.4 g/dL (ref 12.0–15.0)
MCH: 29.4 pg (ref 26.0–34.0)
MCHC: 32.7 g/dL (ref 30.0–36.0)
MCV: 89.9 fL (ref 78.0–100.0)
PLATELETS: 206 10*3/uL (ref 150–400)
RBC: 4.56 MIL/uL (ref 3.87–5.11)
RDW: 13.7 % (ref 11.5–15.5)
WBC: 5.4 10*3/uL (ref 4.0–10.5)

## 2016-06-13 LAB — LIPASE, BLOOD: LIPASE: 27 U/L (ref 11–51)

## 2016-06-13 NOTE — ED Triage Notes (Signed)
Pt states that she has had acid reflux all weekend and started having severe epigastric pain today.  Has been nauseated but reports no vomiting.

## 2016-06-14 ENCOUNTER — Ambulatory Visit (HOSPITAL_COMMUNITY)
Admission: RE | Admit: 2016-06-14 | Discharge: 2016-06-14 | Disposition: A | Discharge status: Home / Self Care | Payer: BLUE CROSS/BLUE SHIELD | Source: Ambulatory Visit | Attending: Emergency Medicine | Admitting: Emergency Medicine

## 2016-06-14 ENCOUNTER — Emergency Department (HOSPITAL_COMMUNITY)
Admission: EM | Admit: 2016-06-14 | Discharge: 2016-06-14 | Disposition: A | Discharge status: Home / Self Care | Payer: BLUE CROSS/BLUE SHIELD | Attending: Emergency Medicine | Admitting: Emergency Medicine

## 2016-06-14 DIAGNOSIS — K8020 Calculus of gallbladder without cholecystitis without obstruction: Secondary | ICD-10-CM | POA: Insufficient documentation

## 2016-06-14 DIAGNOSIS — R1010 Upper abdominal pain, unspecified: Secondary | ICD-10-CM

## 2016-06-14 DIAGNOSIS — R945 Abnormal results of liver function studies: Secondary | ICD-10-CM

## 2016-06-14 DIAGNOSIS — R1011 Right upper quadrant pain: Secondary | ICD-10-CM

## 2016-06-14 NOTE — ED Provider Notes (Signed)
[facility]-EMERGENCY DEPT Provider Note   CSN: [id number] Arrival date & time: 06/13/16  2135     History   Chief Complaint Chief Complaint  Patient presents with  . Abdominal Pain    HPI Emily Hatfield is a 25 y.o. female.  She reports intermittent episodes of "indigestion" over the last several months. He has been experiencing upper abdominal pain in relation to eating. Patient reports that she had pain yesterday that was quite severe and then resolved. Pain came back again tonight so she presents for evaluation. On the waiting room, however, her pain has resolved.      Past Medical History:  Diagnosis Date  . Medical history non-contributory   . Nausea and vomiting during pregnancy prior to [redacted] weeks gestation 08/18/2014  . Pregnant 08/18/2014    Patient Active Problem List   Diagnosis Date Noted  . Varicosities of leg 01/06/2015  . Supervision of normal pregnancy 08/18/2014  . Nausea and vomiting during pregnancy prior to [redacted] weeks gestation 08/18/2014    Past Surgical History:  Procedure Laterality Date  . WISDOM TOOTH EXTRACTION      OB History    Gravida Para Term Preterm AB Living   3 2 2   1 2    SAB TAB Ectopic Multiple Live Births   1     0 2       Home Medications    Prior to Admission medications   Medication Sig Start Date End Date Taking? Authorizing Provider  butalbital-acetaminophen-caffeine (FIORICET, ESGIC) 50-325-40 MG per tablet Take 1 tablet by mouth 2 (two) times daily as needed for headache.    Historical Provider, MD  ibuprofen (ADVIL,MOTRIN) 600 MG tablet Take 1 tablet (600 mg total) by mouth every 6 (six) hours as needed. 03/05/15   Arabella MerlesKimberly D Shaw, CNM  SPRINTEC 28 0.25-35 MG-MCG tablet TAKE ONE TABLET BY MOUTH ONCE DAILY 04/26/16   Jacklyn ShellFrances Cresenzo-Dishmon, CNM    Family History Family History  Problem Relation Age of Onset  . Stroke Paternal Grandmother   . Heart disease Maternal Grandmother   . Cancer Maternal Grandfather     lung    . Hypertension Mother   . Diabetes Mother     borderline    Social History Social History  Substance Use Topics  . Smoking status: Never Smoker  . Smokeless tobacco: Never Used  . Alcohol use No     Allergies   Penicillins   Review of Systems Review of Systems  Gastrointestinal: Positive for abdominal pain.  All other systems reviewed and are negative.    Physical Exam Updated Vital Signs BP 122/71   Pulse 85   Temp 99.8 F (37.7 C) (Oral)   Resp 20   Ht 5\' 6"  (1.676 m)   Wt 175 lb (79.4 kg)   LMP 06/01/2016 (Approximate)   SpO2 100%   BMI 28.25 kg/m   Physical Exam  Constitutional: She is oriented to person, place, and time. She appears well-developed and well-nourished. No distress.  HENT:  Head: Normocephalic and atraumatic.  Right Ear: Hearing normal.  Left Ear: Hearing normal.  Nose: Nose normal.  Mouth/Throat: Oropharynx is clear and moist and mucous membranes are normal.  Eyes: Conjunctivae and EOM are normal. Pupils are equal, round, and reactive to light.  Neck: Normal range of motion. Neck supple.  Cardiovascular: Regular rhythm, S1 normal and S2 normal.  Exam reveals no gallop and no friction rub.   No murmur heard. Pulmonary/Chest: Effort normal and breath sounds  normal. No respiratory distress. She exhibits no tenderness.  Abdominal: Soft. Normal appearance and bowel sounds are normal. There is no hepatosplenomegaly. There is no tenderness. There is no rebound, no guarding, no tenderness at McBurney's point and negative Cislo's sign. No hernia.  Musculoskeletal: Normal range of motion.  Neurological: She is alert and oriented to person, place, and time. She has normal strength. No cranial nerve deficit or sensory deficit. Coordination normal. GCS eye subscore is 4. GCS verbal subscore is 5. GCS motor subscore is 6.  Skin: Skin is warm, dry and intact. No rash noted. No cyanosis.  Psychiatric: She has a normal mood and affect. Her speech is  normal and behavior is normal. Thought content normal.  Nursing note and vitals reviewed.    ED Treatments / Results  Labs (all labs ordered are listed, but only abnormal results are displayed) Labs Reviewed  COMPREHENSIVE METABOLIC PANEL - Abnormal; Notable for the following:       Result Value   Glucose, Bld 108 (*)    AST 179 (*)    ALT 141 (*)    Alkaline Phosphatase 164 (*)    Total Bilirubin 2.4 (*)    All other components within normal limits  LIPASE, BLOOD  CBC  URINALYSIS, ROUTINE W REFLEX MICROSCOPIC (NOT AT [facility])    EKG  EKG Interpretation None       Radiology No results found.  Procedures Procedures (including critical care time)  Medications Ordered in ED Medications - No data to display   Initial Impression / Assessment and Plan / ED Course  I have reviewed the triage vital signs and the nursing notes.  Pertinent labs & imaging results that were available during my care of the patient were reviewed by me and considered in my medical decision making (see chart for details).  Clinical Course    Patient presents to the emergency part for evaluation of abdominal pain has been intermittent for the last several months. This sounds suspicious for biliary colic. Patient did have very mildly elevated LFTs today. Her pain, however, has resolved spontaneously. Examination reveals absolutely no tenderness at this time. Patient therefore appropriate for discharge and follow-up first thing this morning for gallbladder ultrasound. Return sooner if pain returns.  Final Clinical Impressions(s) / ED Diagnoses   Final diagnoses:  RUQ abdominal pain    New Prescriptions New Prescriptions   No medications on file     Gilda Creasehristopher J Pollina, MD 06/14/16 [phone]

## 2016-07-14 NOTE — Patient Instructions (Signed)
Emily Hatfield  07/14/2016     @PREFPERIOPPHARMACY @   Your procedure is scheduled on 07/21/2016.  Report to [facility] at 7:50 A.M.  Call this number if you have problems the morning of surgery:  [phone]   Remember:  Do not eat food or drink liquids after midnight.  Take these medicines the morning of surgery with A SIP OF WATER : Sprintec   Do not wear jewelry, make-up or nail polish.  Do not wear lotions, powders, or perfumes, or deoderant.  Do not shave 48 hours prior to surgery.  Men may shave face and neck.  Do not bring valuables to the hospital.  [facility] is not responsible for any belongings or valuables.  Contacts, dentures or bridgework may not be worn into surgery.  Leave your suitcase in the car.  After surgery it may be brought to your room.  For patients admitted to the hospital, discharge time will be determined by your treatment team.  Patients discharged the day of surgery will not be allowed to drive home.   Name and phone number of your driver:   family Special instructions:  n/a  Please read over the following fact sheets that you were given. Care and Recovery After Surgery    General Anesthesia, Adult General anesthesia is the use of medicines to make a person "go to sleep" (be unconscious) for a medical procedure. General anesthesia is often recommended when a procedure:  Is long.  Requires you to be still or in an unusual position.  Is major and can cause you to lose blood.  Is impossible to do without general anesthesia. The medicines used for general anesthesia are called general anesthetics. In addition to making you sleep, the medicines:  Prevent pain.  Control your blood pressure.  Relax your muscles. Tell a health care provider about:  Any allergies you have.  All medicines you are taking, including vitamins, herbs, eye drops, creams, and over-the-counter medicines.  Any problems you or family members have had with  anesthetic medicines.  Types of anesthetics you have had in the past.  Any bleeding disorders you have.  Any surgeries you have had.  Any medical conditions you have.  Any history of heart or lung conditions, such as heart failure, sleep apnea, or chronic obstructive pulmonary disease (COPD).  Whether you are pregnant or may be pregnant.  Whether you use tobacco, alcohol, marijuana, or street drugs.  Any history of Financial [employer].  Any history of depression or anxiety. What are the risks? Generally, this is a safe procedure. However, problems may occur, including:  Allergic reaction to anesthetics.  Lung and heart problems.  Inhaling food or liquids from your stomach into your lungs (aspiration).  Injury to nerves.  Waking up during your procedure and being unable to move (rare).  Extreme agitation or a state of mental confusion (delirium) when you wake up from the anesthetic.  Air in the bloodstream, which can lead to stroke. These problems are more likely to develop if you are having a major surgery or if you have an advanced medical condition. You can prevent some of these complications by answering all of your health care provider's questions thoroughly and by following all pre-procedure instructions. General anesthesia can cause side effects, including:  Nausea or vomiting  A sore throat from the breathing tube.  Feeling cold or shivery.  Feeling tired, washed out, or achy.  Sleepiness or drowsiness.  Confusion or agitation. What happens before the procedure?  Staying hydrated  Follow instructions from your health care provider about hydration, which may include:  Up to 2 hours before the procedure - you may continue to drink clear liquids, such as water, clear fruit juice, black coffee, and plain tea. Eating and drinking restrictions  Follow instructions from your health care provider about eating and drinking, which may include:  8 hours before the  procedure - stop eating heavy meals or foods such as meat, fried foods, or fatty foods.  6 hours before the procedure - stop eating light meals or foods, such as toast or cereal.  6 hours before the procedure - stop drinking milk or drinks that contain milk.  2 hours before the procedure - stop drinking clear liquids. Medicines  Ask your health care provider about:  Changing or stopping your regular medicines. This is especially important if you are taking diabetes medicines or blood thinners.  Taking medicines such as aspirin and ibuprofen. These medicines can thin your blood. Do not take these medicines before your procedure if your health care provider instructs you not to.  Taking new dietary supplements or medicines. Do not take these during the week before your procedure unless your health care provider approves them.  If you are told to take a medicine or to continue taking a medicine on the day of the procedure, take the medicine with sips of water. General instructions   Ask if you will be going home the same day, the following day, or after a longer hospital stay.  Plan to have someone take you home.  Plan to have someone stay with you for the first 24 hours after you leave the hospital or clinic.  For 3-6 weeks before the procedure, try not to use any tobacco products, such as cigarettes, chewing tobacco, and e-cigarettes.  You may brush your teeth on the morning of the procedure, but make sure to spit out the toothpaste. What happens during the procedure?  You will be given anesthetics through a mask and through an IV tube in one of your veins.  You may receive medicine to help you relax (sedative).  As soon as you are asleep, a breathing tube may be used to help you breathe.  An anesthesia specialist will stay with you throughout the procedure. He or she will help keep you comfortable and safe by continuing to give you medicines and adjusting the amount of medicine  that you get. He or she will also watch your blood pressure, pulse, and oxygen levels to make sure that the anesthetics do not cause any problems.  If a breathing tube was used to help you breathe, it will be removed before you wake up. The procedure may vary among health care providers and hospitals. What happens after the procedure?  You will wake up, often slowly, after the procedure is complete, usually in a recovery area.  Your blood pressure, heart rate, breathing rate, and blood oxygen level will be monitored until the medicines you were given have worn off.  You may be given medicine to help you calm down if you feel anxious or agitated.  If you will be going home the same day, your health care provider may check to make sure you can stand, drink, and urinate.  Your health care providers will treat your pain and side effects before you go home.  Do not drive for 24 hours if you received a sedative.  You may:  Feel nauseous and vomit.  Have a sore throat.  Have mental slowness.  Feel cold or shivery.  Feel sleepy.  Feel tired.  Feel sore or achy, even in parts of your body where you did not have surgery. This information is not intended to replace advice given to you by your health care provider. Make sure you discuss any questions you have with your health care provider. Document Released: 11/01/2007 Document Revised: 01/05/2016 Document Reviewed: 07/09/2015 Elsevier Interactive Patient Education  2017 [employer]  Laparoscopic Cholecystectomy Laparoscopic cholecystectomy is surgery to remove the gallbladder. The gallbladder is a pear-shaped organ that lies beneath the liver on the right side of the body. The gallbladder stores bile, which is a fluid that helps the body to digest fats. Cholecystectomy is often done for inflammation of the gallbladder (cholecystitis). This condition is usually caused by a buildup of gallstones (cholelithiasis) in the gallbladder.  Gallstones can block the flow of bile, which can result in inflammation and pain. In severe cases, emergency surgery may be required. This procedure is done though small incisions in your abdomen (laparoscopic surgery). A thin scope with a camera (laparoscope) is inserted through one incision. Thin surgical instruments are inserted through the other incisions. In some cases, a laparoscopic procedure may be turned into a type of surgery that is done through a larger incision (open surgery). Tell a health care provider about:  Any allergies you have.  All medicines you are taking, including vitamins, herbs, eye drops, creams, and over-the-counter medicines.  Any problems you or family members have had with anesthetic medicines.  Any blood disorders you have.  Any surgeries you have had.  Any medical conditions you have.  Whether you are pregnant or may be pregnant. What are the risks? Generally, this is a safe procedure. However, problems may occur, including:  Infection.  Bleeding.  Allergic reactions to medicines.  Damage to other structures or organs.  A stone remaining in the common bile duct. The common bile duct carries bile from the gallbladder into the small intestine.  A bile leak from the cyst duct that is clipped when your gallbladder is removed. What happens before the procedure? Staying hydrated  Follow instructions from your health care provider about hydration, which may include:  Up to 2 hours before the procedure - you may continue to drink clear liquids, such as water, clear fruit juice, black coffee, and plain tea. Eating and drinking restrictions  Follow instructions from your health care provider about eating and drinking, which may include:  8 hours before the procedure - stop eating heavy meals or foods such as meat, fried foods, or fatty foods.  6 hours before the procedure - stop eating light meals or foods, such as toast or cereal.  6 hours before  the procedure - stop drinking milk or drinks that contain milk.  2 hours before the procedure - stop drinking clear liquids. Medicines  Ask your health care provider about:  Changing or stopping your regular medicines. This is especially important if you are taking diabetes medicines or blood thinners.  Taking medicines such as aspirin and ibuprofen. These medicines can thin your blood. Do not take these medicines before your procedure if your health care provider instructs you not to.  You may be given antibiotic medicine to help prevent infection. General instructions  Let your health care provider know if you develop a cold or an infection before surgery.  Plan to have someone take you home from the hospital or clinic.  Ask your health care provider how your  surgical site will be marked or identified. What happens during the procedure?  To reduce your risk of infection:  Your health care team will wash or sanitize their hands.  Your skin will be washed with soap.  Hair may be removed from the surgical area.  An IV tube may be inserted into one of your veins.  You will be given one or more of the following:  A medicine to help you relax (sedative).  A medicine to make you fall asleep (general anesthetic).  A breathing tube will be placed in your mouth.  Your surgeon will make several small cuts (incisions) in your abdomen.  The laparoscope will be inserted through one of the small incisions. The camera on the laparoscope will send images to a TV screen (monitor) in the operating room. This lets your surgeon see inside your abdomen.  Air-like gas will be pumped into your abdomen. This will expand your abdomen to give the surgeon more room to perform the surgery.  Other tools that are needed for the procedure will be inserted through the other incisions. The gallbladder will be removed through one of the incisions.  Your common bile duct may be examined. If stones are  found in the common bile duct, they may be removed.  After your gallbladder has been removed, the incisions will be closed with stitches (sutures), staples, or skin glue.  Your incisions may be covered with a bandage (dressing). The procedure may vary among health care providers and hospitals. What happens after the procedure?  Your blood pressure, heart rate, breathing rate, and blood oxygen level will be monitored until the medicines you were given have worn off.  You will be given medicines as needed to control your pain.  Do not drive for 24 hours if you were given a sedative. This information is not intended to replace advice given to you by your health care provider. Make sure you discuss any questions you have with your health care provider. Document Released: 07/25/2005 Document Revised: 02/14/2016 Document Reviewed: 01/11/2016 Elsevier Interactive Patient Education  2017 [employer]

## 2016-07-15 ENCOUNTER — Encounter (HOSPITAL_COMMUNITY)
Admission: RE | Admit: 2016-07-15 | Discharge: 2016-07-15 | Disposition: A | Discharge status: Home / Self Care | Payer: BLUE CROSS/BLUE SHIELD | Source: Ambulatory Visit | Attending: Surgery | Admitting: Surgery

## 2016-07-15 ENCOUNTER — Encounter (HOSPITAL_COMMUNITY): Payer: Self-pay

## 2016-07-15 DIAGNOSIS — Z01818 Encounter for other preprocedural examination: Secondary | ICD-10-CM | POA: Diagnosis not present

## 2016-07-15 LAB — COMPREHENSIVE METABOLIC PANEL
ALT: 15 U/L (ref 14–54)
ANION GAP: 6 (ref 5–15)
AST: 17 U/L (ref 15–41)
Albumin: 3.9 g/dL (ref 3.5–5.0)
Alkaline Phosphatase: 99 U/L (ref 38–126)
BILIRUBIN TOTAL: 0.5 mg/dL (ref 0.3–1.2)
BUN: 9 mg/dL (ref 6–20)
CO2: 26 mmol/L (ref 22–32)
Calcium: 9.1 mg/dL (ref 8.9–10.3)
Chloride: 106 mmol/L (ref 101–111)
Creatinine, Ser: 0.68 mg/dL (ref 0.44–1.00)
Glucose, Bld: 89 mg/dL (ref 65–99)
POTASSIUM: 3.6 mmol/L (ref 3.5–5.1)
Sodium: 138 mmol/L (ref 135–145)
TOTAL PROTEIN: 7.2 g/dL (ref 6.5–8.1)

## 2016-07-15 LAB — CBC WITH DIFFERENTIAL/PLATELET
Basophils Absolute: 0 10*3/uL (ref 0.0–0.1)
Basophils Relative: 0 %
EOS PCT: 1 %
Eosinophils Absolute: 0.1 10*3/uL (ref 0.0–0.7)
HEMATOCRIT: 41.6 % (ref 36.0–46.0)
Hemoglobin: 13.3 g/dL (ref 12.0–15.0)
LYMPHS PCT: 31 %
Lymphs Abs: 2.1 10*3/uL (ref 0.7–4.0)
MCH: 28.9 pg (ref 26.0–34.0)
MCHC: 32 g/dL (ref 30.0–36.0)
MCV: 90.4 fL (ref 78.0–100.0)
MONO ABS: 0.4 10*3/uL (ref 0.1–1.0)
MONOS PCT: 7 %
NEUTROS ABS: 4.1 10*3/uL (ref 1.7–7.7)
Neutrophils Relative %: 61 %
PLATELETS: 278 10*3/uL (ref 150–400)
RBC: 4.6 MIL/uL (ref 3.87–5.11)
RDW: 13.1 % (ref 11.5–15.5)
WBC: 6.8 10*3/uL (ref 4.0–10.5)

## 2016-07-15 LAB — HCG, SERUM, QUALITATIVE: PREG SERUM: NEGATIVE

## 2016-07-21 ENCOUNTER — Ambulatory Visit (HOSPITAL_COMMUNITY): Payer: BLUE CROSS/BLUE SHIELD | Admitting: Anesthesiology

## 2016-07-21 ENCOUNTER — Ambulatory Visit (HOSPITAL_COMMUNITY)
Admission: RE | Admit: 2016-07-21 | Discharge: 2016-07-21 | Disposition: A | Discharge status: Home / Self Care | Payer: BLUE CROSS/BLUE SHIELD | Source: Ambulatory Visit | Attending: Surgery | Admitting: Surgery

## 2016-07-21 ENCOUNTER — Encounter (HOSPITAL_COMMUNITY)
Admission: RE | Disposition: A | Discharge status: Home / Self Care | Payer: Self-pay | Source: Ambulatory Visit | Attending: Surgery

## 2016-07-21 ENCOUNTER — Encounter (HOSPITAL_COMMUNITY): Payer: Self-pay | Admitting: *Deleted

## 2016-07-21 DIAGNOSIS — K8010 Calculus of gallbladder with chronic cholecystitis without obstruction: Secondary | ICD-10-CM | POA: Diagnosis not present

## 2016-07-21 DIAGNOSIS — K8020 Calculus of gallbladder without cholecystitis without obstruction: Secondary | ICD-10-CM | POA: Diagnosis present

## 2016-07-21 DIAGNOSIS — K219 Gastro-esophageal reflux disease without esophagitis: Secondary | ICD-10-CM | POA: Diagnosis not present

## 2016-07-21 HISTORY — PX: CHOLECYSTECTOMY: SHX55

## 2016-07-21 SURGERY — LAPAROSCOPIC CHOLECYSTECTOMY
Anesthesia: General | Site: Abdomen

## 2016-07-21 MED ORDER — GLYCOPYRROLATE 0.2 MG/ML IJ SOLN
INTRAMUSCULAR | Status: AC
  Filled 2016-07-21: qty 2

## 2016-07-21 MED ORDER — MIDAZOLAM HCL 2 MG/2ML IJ SOLN
INTRAMUSCULAR | Status: AC
  Filled 2016-07-21: qty 2

## 2016-07-21 MED ORDER — PROPOFOL 10 MG/ML IV BOLUS
INTRAVENOUS | Status: AC
  Filled 2016-07-21: qty 20

## 2016-07-21 MED ORDER — DEXAMETHASONE SODIUM PHOSPHATE 4 MG/ML IJ SOLN
4.0000 mg | Freq: Once | INTRAMUSCULAR | Status: AC
  Administered 2016-07-21: 4 mg via INTRAVENOUS

## 2016-07-21 MED ORDER — GLYCOPYRROLATE 0.2 MG/ML IJ SOLN
INTRAMUSCULAR | Status: AC
  Filled 2016-07-21: qty 1

## 2016-07-21 MED ORDER — ATROPINE SULFATE 0.4 MG/ML IJ SOLN
INTRAMUSCULAR | Status: DC | PRN
  Administered 2016-07-21: 0.2 mg via INTRAVENOUS

## 2016-07-21 MED ORDER — ATROPINE SULFATE 0.4 MG/ML IJ SOLN
INTRAMUSCULAR | Status: AC
  Filled 2016-07-21: qty 1

## 2016-07-21 MED ORDER — CIPROFLOXACIN IN D5W 400 MG/200ML IV SOLN
INTRAVENOUS | Status: AC
  Filled 2016-07-21: qty 200

## 2016-07-21 MED ORDER — GLYCOPYRROLATE 0.2 MG/ML IV SOSY
PREFILLED_SYRINGE | INTRAVENOUS | Status: DC | PRN
  Administered 2016-07-21: 0.4 mg via INTRAVENOUS

## 2016-07-21 MED ORDER — CIPROFLOXACIN IN D5W 400 MG/200ML IV SOLN
400.0000 mg | INTRAVENOUS | Status: AC
  Administered 2016-07-21: 400 mg via INTRAVENOUS

## 2016-07-21 MED ORDER — LACTATED RINGERS IV SOLN
INTRAVENOUS | Status: DC
  Administered 2016-07-21: 09:00:00 via INTRAVENOUS

## 2016-07-21 MED ORDER — GLYCOPYRROLATE 0.2 MG/ML IJ SOLN: 0.1000 mg | Freq: Once | INTRAMUSCULAR | Status: DC

## 2016-07-21 MED ORDER — ONDANSETRON HCL 4 MG/2ML IJ SOLN
INTRAMUSCULAR | Status: AC
  Filled 2016-07-21: qty 2

## 2016-07-21 MED ORDER — ROCURONIUM BROMIDE 100 MG/10ML IV SOLN
INTRAVENOUS | Status: DC | PRN
  Administered 2016-07-21: 50 mg via INTRAVENOUS

## 2016-07-21 MED ORDER — ROCURONIUM BROMIDE 50 MG/5ML IV SOLN
INTRAVENOUS | Status: AC
  Filled 2016-07-21: qty 1

## 2016-07-21 MED ORDER — FENTANYL CITRATE (PF) 100 MCG/2ML IJ SOLN
INTRAMUSCULAR | Status: AC
  Filled 2016-07-21: qty 2

## 2016-07-21 MED ORDER — FENTANYL CITRATE (PF) 100 MCG/2ML IJ SOLN
25.0000 ug | INTRAMUSCULAR | Status: DC | PRN
  Administered 2016-07-21: 50 ug via INTRAVENOUS

## 2016-07-21 MED ORDER — ONDANSETRON HCL 4 MG/2ML IJ SOLN
4.0000 mg | Freq: Once | INTRAMUSCULAR | Status: AC
  Administered 2016-07-21: 4 mg via INTRAVENOUS

## 2016-07-21 MED ORDER — CHLORHEXIDINE GLUCONATE CLOTH 2 % EX PADS: 6.0000 | MEDICATED_PAD | Freq: Once | CUTANEOUS | Status: DC

## 2016-07-21 MED ORDER — GLYCOPYRROLATE 0.2 MG/ML IJ SOLN
0.2000 mg | Freq: Once | INTRAMUSCULAR | Status: AC
Start: 2016-07-21 — End: 2016-07-21
  Administered 2016-07-21: 0.2 mg via INTRAVENOUS

## 2016-07-21 MED ORDER — OXYCODONE-ACETAMINOPHEN 5-325 MG PO TABS: 1.0000 | ORAL_TABLET | ORAL | 0 refills | Status: DC | PRN

## 2016-07-21 MED ORDER — HEMOSTATIC AGENTS (NO CHARGE) OPTIME
TOPICAL | Status: DC | PRN
  Administered 2016-07-21: 1 via TOPICAL

## 2016-07-21 MED ORDER — NEOSTIGMINE METHYLSULFATE 5 MG/5ML IV SOSY
PREFILLED_SYRINGE | INTRAVENOUS | Status: DC | PRN
  Administered 2016-07-21: 3 mg via INTRAVENOUS

## 2016-07-21 MED ORDER — LIDOCAINE HCL (PF) 1 % IJ SOLN
INTRAMUSCULAR | Status: AC
  Filled 2016-07-21: qty 30

## 2016-07-21 MED ORDER — BUPIVACAINE HCL (PF) 0.5 % IJ SOLN
INTRAMUSCULAR | Status: AC
  Filled 2016-07-21: qty 30

## 2016-07-21 MED ORDER — SODIUM CHLORIDE 0.9 % IR SOLN
Status: DC | PRN
  Administered 2016-07-21: 1000 mL

## 2016-07-21 MED ORDER — FENTANYL CITRATE (PF) 250 MCG/5ML IJ SOLN
INTRAMUSCULAR | Status: AC
  Filled 2016-07-21: qty 5

## 2016-07-21 MED ORDER — PROPOFOL 10 MG/ML IV BOLUS
INTRAVENOUS | Status: DC | PRN
  Administered 2016-07-21: 200 mg via INTRAVENOUS

## 2016-07-21 MED ORDER — MIDAZOLAM HCL 2 MG/2ML IJ SOLN
1.0000 mg | INTRAMUSCULAR | Status: DC | PRN
  Administered 2016-07-21 (×2): 1 mg via INTRAVENOUS

## 2016-07-21 MED ORDER — FENTANYL CITRATE (PF) 100 MCG/2ML IJ SOLN
INTRAMUSCULAR | Status: DC | PRN
  Administered 2016-07-21: 100 ug via INTRAVENOUS
  Administered 2016-07-21: 50 ug via INTRAVENOUS
  Administered 2016-07-21: 100 ug via INTRAVENOUS

## 2016-07-21 MED ORDER — DEXAMETHASONE SODIUM PHOSPHATE 4 MG/ML IJ SOLN
INTRAMUSCULAR | Status: AC
  Filled 2016-07-21: qty 1

## 2016-07-21 MED ORDER — LIDOCAINE HCL 1 % IJ SOLN
INTRAMUSCULAR | Status: DC | PRN
  Administered 2016-07-21: 19 mL

## 2016-07-21 SURGICAL SUPPLY — 46 items
APPLIER CLIP LAPSCP 10X32 DD (CLIP) ×3 IMPLANT
BAG HAMPER (MISCELLANEOUS) ×3 IMPLANT
CHLORAPREP W/TINT 26ML (MISCELLANEOUS) ×3 IMPLANT
CLOTH BEACON ORANGE TIMEOUT ST (SAFETY) ×3 IMPLANT
COVER LIGHT HANDLE STERIS (MISCELLANEOUS) ×6 IMPLANT
DECANTER SPIKE VIAL GLASS SM (MISCELLANEOUS) ×6 IMPLANT
DERMABOND ADVANCED (GAUZE/BANDAGES/DRESSINGS) ×2
DERMABOND ADVANCED .7 DNX12 (GAUZE/BANDAGES/DRESSINGS) ×1 IMPLANT
DEVICE TROCAR PUNCTURE CLOSURE (ENDOMECHANICALS) ×3 IMPLANT
ELECT REM PT RETURN 9FT ADLT (ELECTROSURGICAL) ×3
ELECTRODE REM PT RTRN 9FT ADLT (ELECTROSURGICAL) ×1 IMPLANT
FILTER SMOKE EVAC LAPAROSHD (FILTER) ×3 IMPLANT
FORMALIN 10 PREFIL 120ML (MISCELLANEOUS) ×3 IMPLANT
GLOVE BIOGEL M 6.5 STRL (GLOVE) ×3 IMPLANT
GLOVE BIOGEL PI IND STRL 7.0 (GLOVE) ×2 IMPLANT
GLOVE BIOGEL PI IND STRL 7.5 (GLOVE) ×1 IMPLANT
GLOVE BIOGEL PI INDICATOR 7.0 (GLOVE) ×4
GLOVE BIOGEL PI INDICATOR 7.5 (GLOVE) ×2
GLOVE ECLIPSE 6.5 STRL STRAW (GLOVE) ×3 IMPLANT
GLOVE ECLIPSE 7.0 STRL STRAW (GLOVE) ×3 IMPLANT
GLOVE EXAM NITRILE MD LF STRL (GLOVE) ×3 IMPLANT
GOWN STRL REUS W/ TWL XL LVL3 (GOWN DISPOSABLE) ×1 IMPLANT
GOWN STRL REUS W/TWL LRG LVL3 (GOWN DISPOSABLE) ×6 IMPLANT
GOWN STRL REUS W/TWL XL LVL3 (GOWN DISPOSABLE) ×2
HEMOSTAT SNOW SURGICEL 2X4 (HEMOSTASIS) ×3 IMPLANT
INST SET LAPROSCOPIC AP (KITS) ×3 IMPLANT
IV NS IRRIG 3000ML ARTHROMATIC (IV SOLUTION) IMPLANT
KIT ROOM TURNOVER APOR (KITS) ×3 IMPLANT
MANIFOLD NEPTUNE II (INSTRUMENTS) ×3 IMPLANT
NEEDLE INSUFFLATION 14GA 120MM (NEEDLE) ×3 IMPLANT
NS IRRIG 1000ML POUR BTL (IV SOLUTION) ×3 IMPLANT
PACK LAP CHOLE LZT030E (CUSTOM PROCEDURE TRAY) ×3 IMPLANT
PAD ARMBOARD 7.5X6 YLW CONV (MISCELLANEOUS) ×3 IMPLANT
POUCH SPECIMEN RETRIEVAL 10MM (ENDOMECHANICALS) ×3 IMPLANT
SET BASIN LINEN APH (SET/KITS/TRAYS/PACK) ×3 IMPLANT
SET TUBE IRRIG SUCTION NO TIP (IRRIGATION / IRRIGATOR) IMPLANT
SLEEVE ENDOPATH XCEL 5M (ENDOMECHANICALS) ×6 IMPLANT
SUT VIC AB 4-0 PS2 27 (SUTURE) ×3 IMPLANT
SUT VICRYL 0 UR6 27IN ABS (SUTURE) ×3 IMPLANT
SUT VICRYL AB 3-0 FS1 BRD 27IN (SUTURE) ×3 IMPLANT
TROCAR ENDO BLADELESS 11MM (ENDOMECHANICALS) ×3 IMPLANT
TROCAR XCEL NON-BLD 5MMX100MML (ENDOMECHANICALS) ×3 IMPLANT
TUBE CONNECTING 12'X1/4 (SUCTIONS)
TUBE CONNECTING 12X1/4 (SUCTIONS) IMPLANT
TUBING INSUFFLATION (TUBING) ×3 IMPLANT
WARMER LAPAROSCOPE (MISCELLANEOUS) ×3 IMPLANT

## 2016-07-21 NOTE — Anesthesia Preprocedure Evaluation (Signed)
Anesthesia Evaluation  Patient identified by MRN, date of birth, ID band Patient awake    Reviewed: Allergy & Precautions, NPO status , Patient's Chart, lab work & pertinent test results  Airway Mallampati: II  TM Distance: >3 FB Neck ROM: Full    Dental  (+) Teeth Intact   Pulmonary neg pulmonary ROS,    breath sounds clear to auscultation       Cardiovascular negative cardio ROS   Rhythm:Regular Rate:Normal     Neuro/Psych    GI/Hepatic negative GI ROS,   Endo/Other    Renal/GU      Musculoskeletal   Abdominal   Peds  Hematology   Anesthesia Other Findings   Reproductive/Obstetrics                             Anesthesia Physical Anesthesia Plan  ASA: I  Anesthesia Plan: General   Post-op Pain Management:    Induction: Intravenous  Airway Management Planned: Oral ETT  Additional Equipment:   Intra-op Plan:   Post-operative Plan: Extubation in OR  Informed Consent: I have reviewed the patients History and Physical, chart, labs and discussed the procedure including the risks, benefits and alternatives for the proposed anesthesia with the patient or authorized representative who has indicated his/her understanding and acceptance.     Plan Discussed with:   Anesthesia Plan Comments:         Anesthesia Quick Evaluation

## 2016-07-21 NOTE — Anesthesia Postprocedure Evaluation (Signed)
Anesthesia Post Note  Patient: Emily Hatfield  Procedure(s) Performed: Procedure(s) (LRB): LAPAROSCOPIC CHOLECYSTECTOMY (N/A)  Patient location during evaluation: PACU Anesthesia Type: General Level of consciousness: awake Pain management: pain level controlled Vital Signs Assessment: post-procedure vital signs reviewed and stable Respiratory status: spontaneous breathing Cardiovascular status: stable Anesthetic complications: no    Last Vitals:  Vitals:   07/21/16 0905 07/21/16 1046  BP: 104/60 (!) 144/81  Pulse:  73  Resp: (!) 24 (!) 21  Temp:  37.1 C    Last Pain:  Vitals:   07/21/16 0826  TempSrc: Oral                 Aliyha Fornes

## 2016-07-21 NOTE — Addendum Note (Signed)
Addendum  created 07/21/16 1513 by Franco Noneseresa S Aleeza [place], CRNA   Anesthesia Staff edited

## 2016-07-21 NOTE — Discharge Instructions (Addendum)
In addition to included general post-operative instructions for Laparoscopic Cholecystectomy,  Diet: Resume home heart healthy diet.   Activity: No heavy lifting >20 pounds (children, pets, laundry, garbage) or strenuous activity until follow-up, but light activity and walking are encouraged. Do not drive or drink alcohol if taking narcotic pain medications.  Wound care: 2 days after surgery (Saturday, 12/16), may shower/get incision wet with soapy water and pat dry (do not rub incisions), but no baths or submerging incision underwater until follow-up.   Medications: Resume all home medications. For mild to moderate pain: acetaminophen (Tylenol) or ibuprofen (if no kidney disease). Narcotic pain medications, if prescribed, can be used for severe pain, though may cause nausea, constipation, and drowsiness. Do not combine Tylenol and Percocet within a 6 hour period as Percocet contains Tylenol. If you do not need the narcotic pain medication, you do not need to fill the prescription.  Call office [phone]) at any time if any questions, worsening pain, fevers/chills, bleeding, drainage from incision site, or other concerns.

## 2016-07-21 NOTE — Op Note (Signed)
SURGICAL OPERATIVE REPORT   DATE OF PROCEDURE: 07/21/2016  ATTENDING Surgeon(s): Ancil LinseyJason Evan Davis, MD  ANESTHESIA: GETA  PRE-OPERATIVE DIAGNOSIS: Symptomatic Cholelithiasis (K80.20)  POST-OPERATIVE DIAGNOSIS: Symptomatic Cholelithiasis (K80.20)  PROCEDURE(S): (cpt's: 47562) 1.) Laparoscopic Cholecystectomy  INTRAOPERATIVE FINDINGS: Mild peri-cholecystic inflammation with clearly identified cystic duct, cystic artery, and well-secured clips on both  INTRAOPERATIVE FLUIDS: 800 mL crystalloid   ESTIMATED BLOOD LOSS: Minimal (<30 mL)   URINE OUTPUT: No foley  SPECIMENS: Gallbladder  IMPLANTS: None  DRAINS: None   COMPLICATIONS: None apparent   CONDITION AT COMPLETION: Hemodynamically stable and extubated  DISPOSITION: PACU   INDICATION(S) FOR PROCEDURE:  Patient is a 25 y.o. female who previously presented with post-prandial RUQ > epigastric abdominal pain after eating fatty foods in particular. Ultrasound suggested cholelithiasis without acute cholecystitis. All risks, benefits, and alternatives to above elective procedures were discussed with the patient, who elected to proceed, and informed consent was accordingly obtained at that time.   DETAILS OF PROCEDURE:  Patient was brought to the operating suite and appropriately identified. General anesthesia was administered along with peri-operative prophylactic IV antibiotics, and endotracheal intubation was performed by anesthesiologist, along with NG/OG tube for gastric decompression. In supine position, operative site was prepped and draped in usual sterile fashion, and following a brief time out, initial 5 mm incision was made in a natural skin crease just above the umbilicus. Fascia was then elevated, and a Verress needle was inserted and its proper position confirmed using aspiration and saline meniscus test.  Upon insufflation of the abdominal cavity with carbon dioxide to a well-tolerated pressure of 12-15 mmHg, 5 mm  peri-umbilical port followed by laparoscope were inserted and used to inspect the abdominal cavity and its contents with no injuries from insertion of the first trochar noted. Three additional trocars were inserted, one at the epigastric position (10 mm) and two along the Right costal margin (5 mm). The table was then placed in reverse Trendelenburg position with the Right side up. Filmy adhesions between the gallbladder and omentum/duodenum/transverse colon were lysed using combined blunt and sharp dissection. The apex/dome of the gallbladder was grasped with an atraumatic grasper passed through the lateral port and retracted apically over the liver. The infundibulum was also grasped and retracted, exposing Calot's triangle. The peritoneum overlying the gallbladder infundibulum was incised and dissected free of surrounding peritoneal attachments, revealing the cystic duct and cystic artery, which were clipped twice on the patient side and once on the gallbladder specimen side close to the gallbladder. Endoclose laparoscopic fascial closure device was then used to re-approximate fascia at the 10 mm epigastric port site.  All ports were then removed under direct visualization, and abdominal cavity was desuflated. All port sites were irrigated/cleaned, additional local anesthetic was injected at each incision, 3-0 Vicryl was used to re-approximate dermis at 10 mm port site(s), and subcuticular 4-0 Monocryl suture was used to re-approximate skin. Skin was then cleaned, dried, and sterile skin glue was applied. Patient was then safely able to be awakened, extubated, and transferred to PACU for post-operative monitoring and care.   I was present for all aspects of procedure, and there were no intra-operative complications apparent.

## 2016-07-21 NOTE — Transfer of Care (Signed)
Immediate Anesthesia Transfer of Care Note  Patient: Emily GratesShelby D Darcey  Procedure(s) Performed: Procedure(s): LAPAROSCOPIC CHOLECYSTECTOMY (N/A)  Patient Location: PACU  Anesthesia Type:General  Level of Consciousness: awake, alert  and oriented  Airway & Oxygen Therapy: Patient Spontanous Breathing and Patient connected to nasal cannula oxygen  Post-op Assessment: Report given to RN and Post -op Vital signs reviewed and stable  Post vital signs: Reviewed and stable  Last Vitals:  Vitals:   07/21/16 0855 07/21/16 0905  BP: 104/68 104/60  Pulse:    Resp: (!) 26 (!) 24  Temp:      Last Pain:  Vitals:   07/21/16 0826  TempSrc: Oral      Patients Stated Pain Goal: 8 (07/21/16 0826)  Complications: No apparent anesthesia complications

## 2016-07-21 NOTE — Anesthesia Procedure Notes (Signed)
Procedure Name: Intubation Date/Time: 07/21/2016 9:32 AM Performed by: Shanon PayorGREGORY, Jennyfer Nickolson M Pre-anesthesia Checklist: Patient identified, Emergency Drugs available, Suction available, Patient being monitored and Timeout performed Patient Re-evaluated:Patient Re-evaluated prior to inductionOxygen Delivery Method: Circle system utilized Preoxygenation: Pre-oxygenation with 100% oxygen Intubation Type: IV induction Ventilation: Mask ventilation without difficulty Laryngoscope Size: Mac and 3 Grade View: Grade I Tube type: Oral Tube size: 7.0 mm Number of attempts: 1 Airway Equipment and Method: Stylet Placement Confirmation: ETT inserted through vocal cords under direct vision,  positive ETCO2 and breath sounds checked- equal and bilateral Secured at: 22 cm Tube secured with: Tape Dental Injury: Teeth and Oropharynx as per pre-operative assessment

## 2016-07-21 NOTE — H&P (Signed)
Surgical History and Physical Exam  Subjective: 25 year old female presents forevaluation of post-prandial RUQ > epigastric abdominal pain that she experiences most after eating foods with higher fat content, such as meats, cheeses, dairy, and fried foods. She first noticed the pain while pregnant with her third child 1 year and 3 months ago. At that time, she was told it was reflux and was prescribed Zantac, which provided some partial relief, but after her son was born, she reports her post-prandial RUQ abdominal pain worsened and was not at all relieved with Zantac. She was also advised to drink milk for what was attributed to GERD, but mild made her pain worse. Since that time, her pain has been more frequent and increasingly worse. ~1 week ago, patient presented to ED after an episode of severe RUQ abdominal pain that became worse after eating chicken dumplings. Her mom advised her that her pain could be due to a problem with her gallbladder and advised her to stop eating meats, cheeses, dairy, and fried foods. Since she's stopped eating these foods, she has not again experienced post-prandial RUQ abdominal pain. She otherwise denies any fever/chills, CP, SOB, or N/V with routine flatus and BM's.   Review of Symptoms:  Constitutional:No fevers, chills, or unexplained weight loss Head:Atraumatic; no masses; no abnormalities Eyes:No visual changes or eye pain Cardiovascular: No chest pain or palpitations.  Respiratory:No cough, shortness of breath or wheezing  GastrointestinAbdominal pain, N/V, and bowel function as per HPI Genitourinary:No urinary frequency, hematuria, incontinence, or dysuria Musculoskeletal:No arthalgias, myalgias or joint swelling Skin:No rash or bothersome skin lesions  Past Medical History:Reviewed  Past Medical History  Pregnancy Gravida:  3 Pregnancy Para:  3 Surgical History:  childbirth, wisdom tooth extraction Medical Problems:  pregnancy, varicose  veins, GERD only during pregnancy Allergies:   none Medications:   none  Social History:Reviewed  Social History  Preferred Language: English Race:  White Ethnicity: Not Hispanic / Latino Age: [age] year Marital Status:  M  Smoking Status: Never smoker reviewed on 07/01/2016  Functional Status ------------------------------------------------ Bathing: Normal Cooking: Normal Dressing: Normal Driving: Normal Eating: Normal Managing Meds: Normal Oral Care: Normal Shopping: Normal Toileting: Normal Transferring: Normal Walking: Normal  Cognitive Status ------------------------------------------------ Attention: Normal Decision Making: Normal Language: Normal Memory: Normal Motor: Normal Perception: Normal Problem Solving: Normal Visual and Spatial: Normal  Family History:Reviewed  Family Health History Mother, Living; Diabetes mellitus, type 2 (adult); Hypertension (high blood pressure);  Paternal Grandmother, Deceased; Stroke (CVA);  Maternal Grandmother, Deceased; Heart disease;  Maternal Grandfather, Deceased; Lung cancer;   Vital Signs as of 06/29/2016:  Systolic 111: Diastolic 64: Heart Rate 49: Temp 98.68F (Temporal) Height 705ft 6in: Weight 177Lbs 0 Ounces:  BMI 28.57 kg/m2  Physical Exam: General:Well appearing, well nourished in no distress. Skin:no rash or prominent lesions Head:Atraumatic; no masses; no abnormalities Eyes:conjunctiva clear, EOM intact, PERRL Heart:RRR, no murmur Lungs:CTA bilaterally, no wheezes, rhonchi, rales.  Breathing unlabored. Abdomen:Soft, overweight, NT/ND, no HSM, no masses. Extremities:No deformities, clubbing, cyanosis, or edema.   Assessment: 25 year old Female with symptomatic cholelithiasis, possible early mild cholecystitis on recent abdominal ultrasound, complicated by pertinent comorbidities including GERD only during pregnancy and varicose veins.  Plan:      - non-fat diet advised and discussed       - all risks, benefits, and alternatives to cholecystectomy were discussed with patient, who elects to proceed, and informed consent was accordingly obtained      - will plan to proceed with laparoscopic cholecystectomy  as scheduled      - surgical follow-up 2 weeks after planned procedure  -- Emily CowerJason E. Earlene Plateravis, MD, RPVI [place]: [facility] Office #: [phone]

## 2016-07-25 ENCOUNTER — Encounter (HOSPITAL_COMMUNITY): Payer: Self-pay | Admitting: Surgery

## 2017-03-11 IMAGING — US US ABDOMEN LIMITED
1 series · 14 of 25 positions shown · non-contrast
Comparison: None in PACs

CLINICAL DATA: Upper abdominal pain and elevated liver function
studies.

EXAM:
US ABDOMEN LIMITED - RIGHT UPPER QUADRANT

[Series 1: us abdomen limited · 0.18mm/px · 14 of 60 slices shown]
[im 1/60]
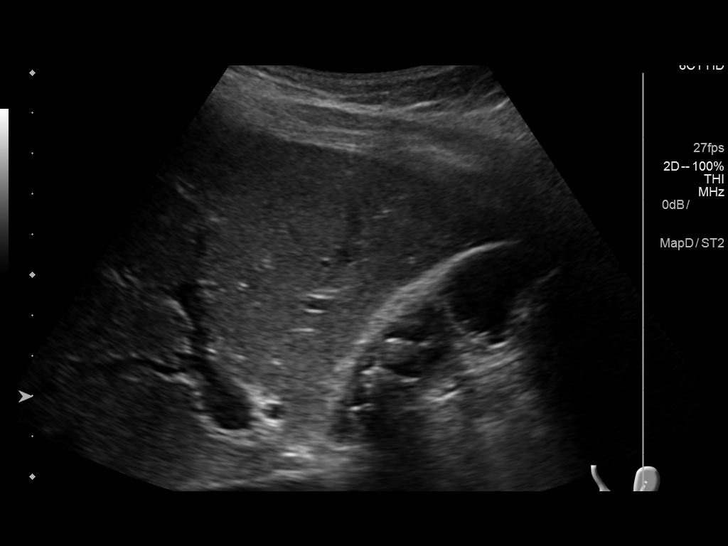
[im 5/60]
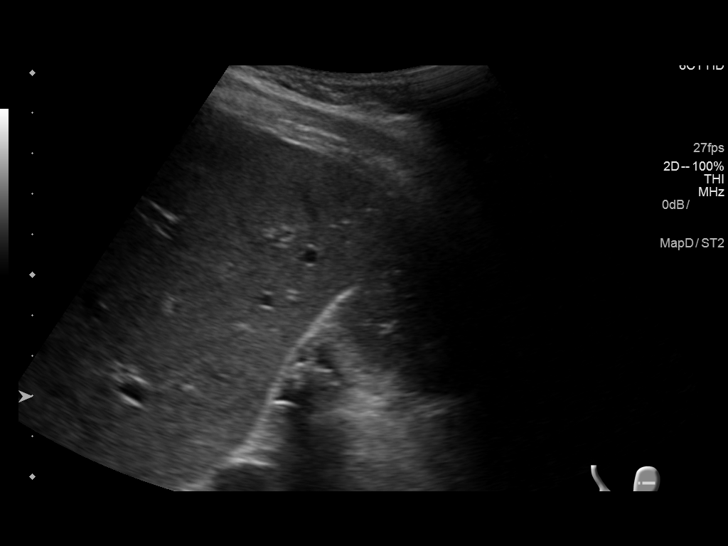
[im 10/60]
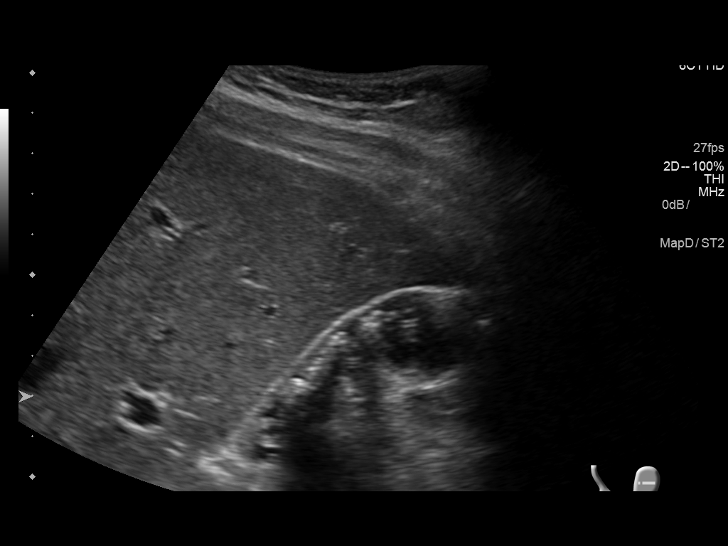
[im 15/60]
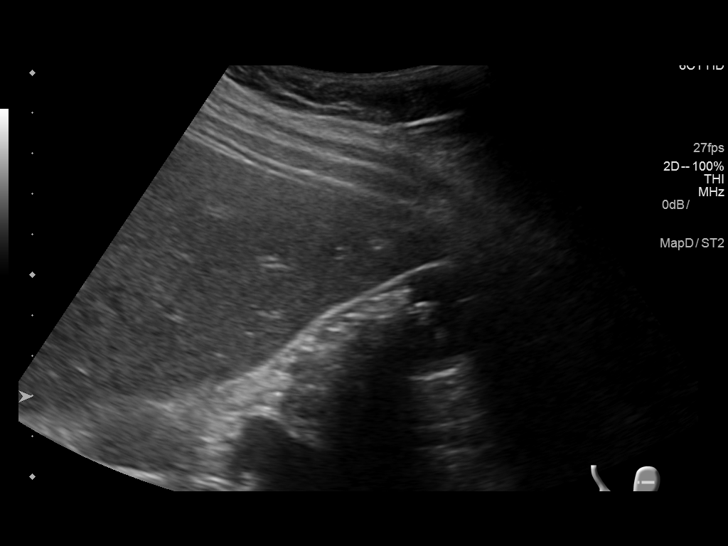
[im 20/60]
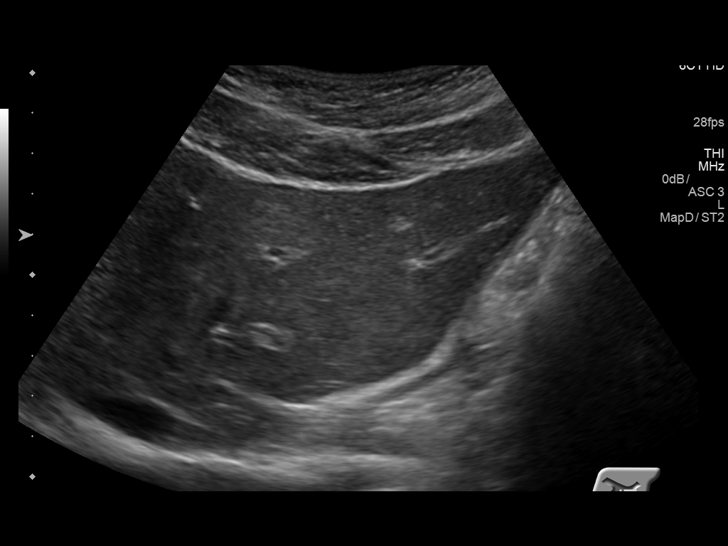
[im 23/60]
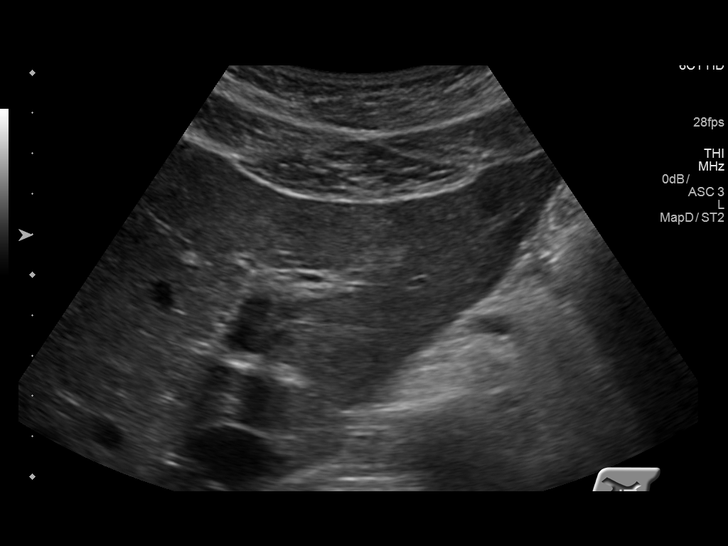
[im 28/60]
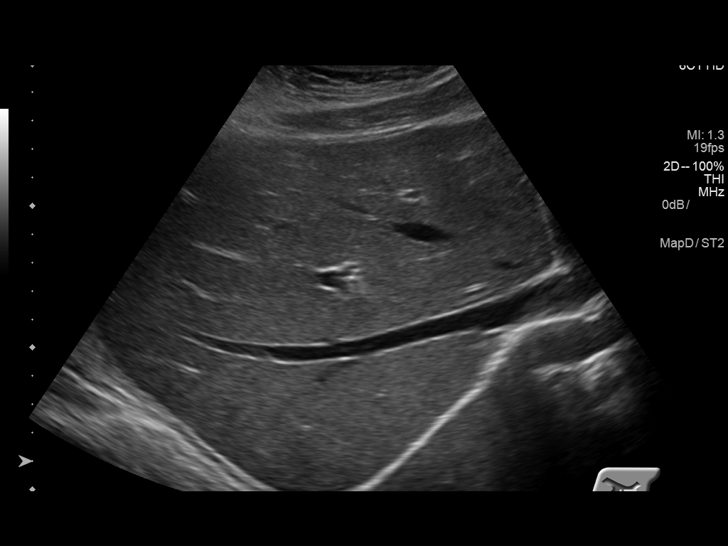
[im 32/60]
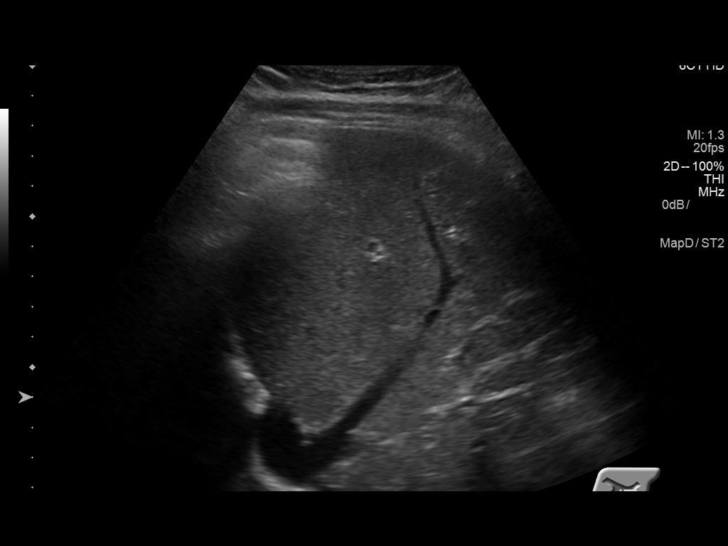
[im 37/60]
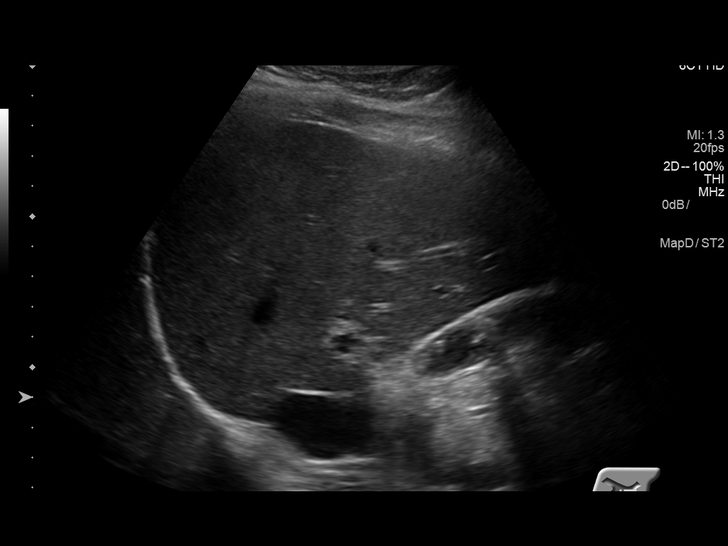
[im 40/60]
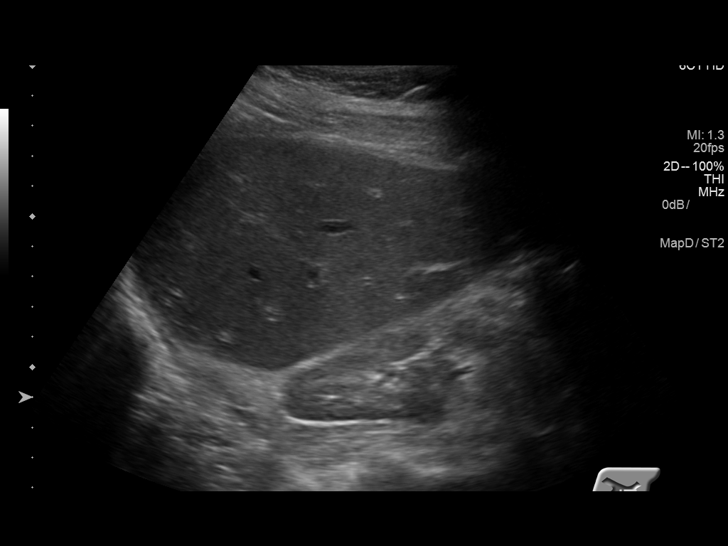
[im 45/60]
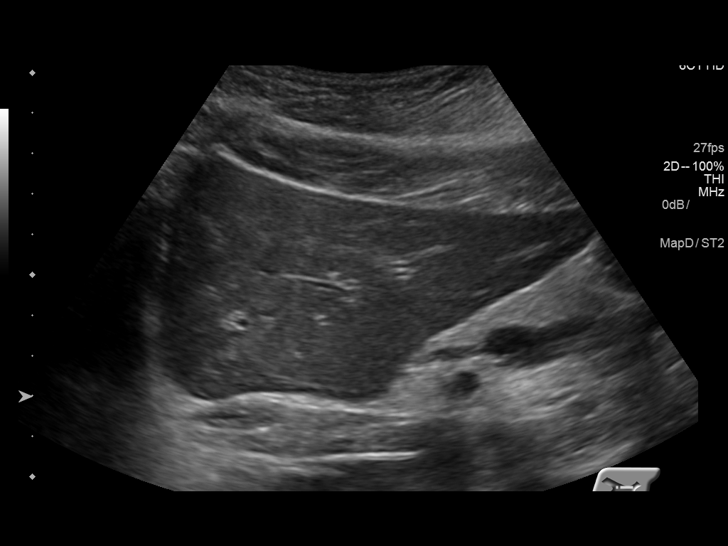
[im 50/60]
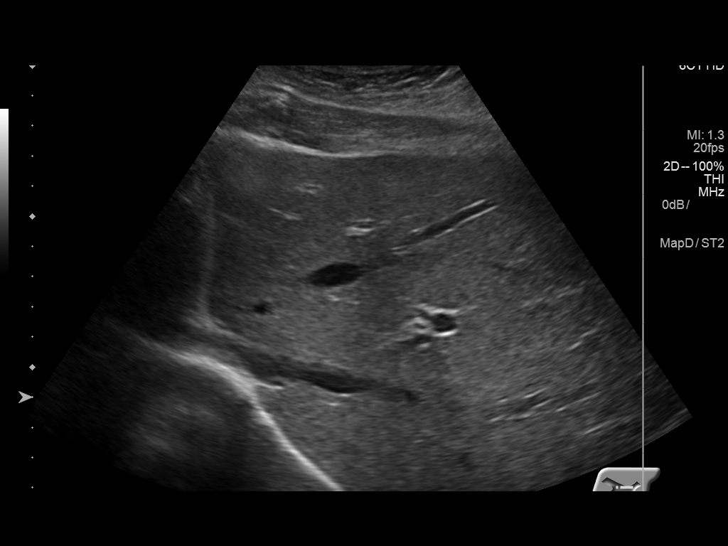
[im 55/60]
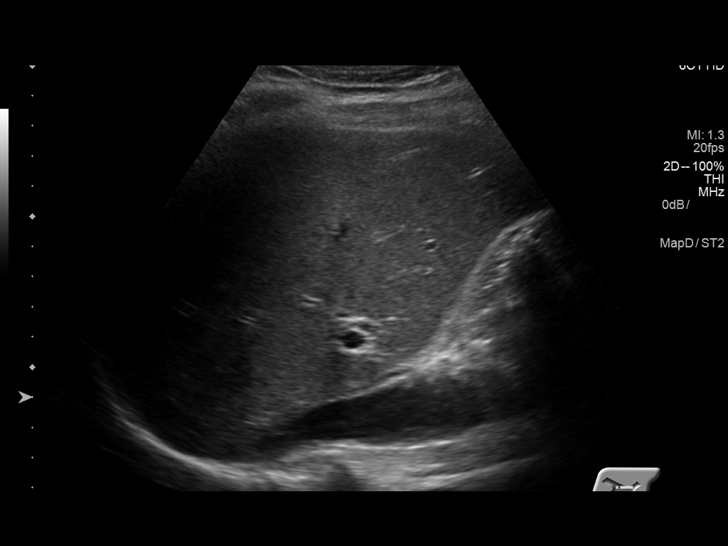
[im 60/60]
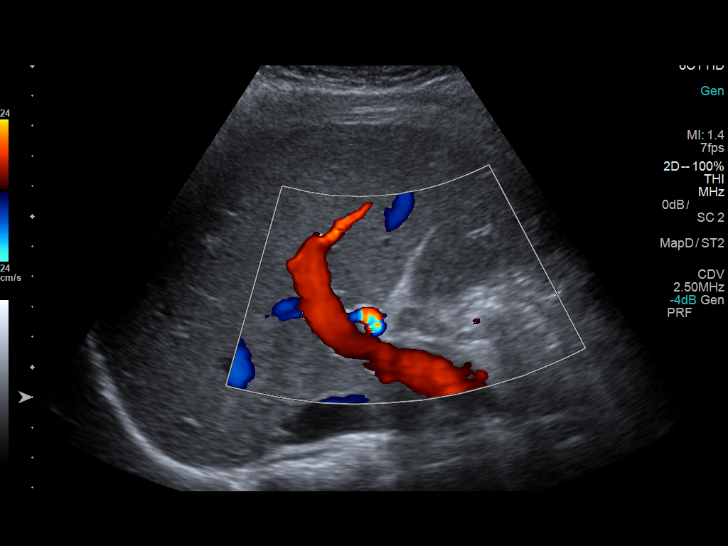

[14 of 25 positions shown; findings below may reference images not displayed]

FINDINGS: Gallbladder:

The gallbladder is adequately distended. There are multiple
echogenic mobile shadowing stones. There is minimal gallbladder wall
thickening to 3.6 mm. There is no positive sonographic Murphy's
sign.

Common bile duct:

Diameter: 2.9 mm

Liver:

The hepatic echotexture is normal. There is no focal mass or ductal
dilation or surface contour irregularity.
IMPRESSION: Multiple gallstones. Mild gallbladder wall thickening. No positive
sonographic Murphy's sign.

Normal appearance of the liver and common bile duct.

## 2017-04-17 ENCOUNTER — Other Ambulatory Visit: Payer: Self-pay | Admitting: Advanced Practice Midwife

## 2017-04-17 ENCOUNTER — Telehealth: Payer: Self-pay | Admitting: Advanced Practice Midwife

## 2017-04-17 MED ORDER — NORGESTIMATE-ETH ESTRADIOL 0.25-35 MG-MCG PO TABS: 1.0000 | ORAL_TABLET | Freq: Every day | ORAL | 0 refills | Status: DC

## 2017-04-17 NOTE — Telephone Encounter (Signed)
Pt called requesting refill on BCP. Informed pt that it had been over a year since her last physical and she would need to make an appt. Informed pt that I would send request to a provider for a month supply and she should check with her pharmacy later today. Pt verbalized understanding.

## 2017-04-28 ENCOUNTER — Other Ambulatory Visit: Payer: BLUE CROSS/BLUE SHIELD | Admitting: Women's Health

## 2017-05-01 ENCOUNTER — Encounter: Payer: Self-pay | Admitting: Women's Health

## 2017-05-01 ENCOUNTER — Ambulatory Visit (INDEPENDENT_AMBULATORY_CARE_PROVIDER_SITE_OTHER): Payer: BLUE CROSS/BLUE SHIELD | Admitting: Women's Health

## 2017-05-01 ENCOUNTER — Other Ambulatory Visit (HOSPITAL_COMMUNITY)
Admission: RE | Admit: 2017-05-01 | Discharge: 2017-05-01 | Disposition: A | Discharge status: Home / Self Care | Payer: BLUE CROSS/BLUE SHIELD | Source: Ambulatory Visit | Attending: Obstetrics & Gynecology | Admitting: Obstetrics & Gynecology

## 2017-05-01 DIAGNOSIS — Z01419 Encounter for gynecological examination (general) (routine) without abnormal findings: Secondary | ICD-10-CM | POA: Diagnosis not present

## 2017-05-01 DIAGNOSIS — F17200 Nicotine dependence, unspecified, uncomplicated: Secondary | ICD-10-CM

## 2017-05-01 MED ORDER — NORGESTIMATE-ETH ESTRADIOL 0.25-35 MG-MCG PO TABS
1.0000 | ORAL_TABLET | Freq: Every day | ORAL | 11 refills | Status: DC
Start: 2017-05-01 — End: 2018-04-27

## 2017-05-01 NOTE — Addendum Note (Signed)
Addended by: Moss Mc on: 05/01/2017 01:58 PM   Modules accepted: Orders

## 2017-05-01 NOTE — Patient Instructions (Signed)
Laparoscopic Tubal Ligation Laparoscopic tubal ligation is a procedure to close the fallopian tubes. This is done so that you cannot get pregnant. When the fallopian tubes are closed, the eggs that your ovaries release cannot enter the uterus, and sperm cannot reach the released eggs. A laparoscopic tubal ligation is sometimes called "getting your tubes tied." You should not have this procedure if you want to get pregnant someday or if you are unsure about having more children. Tell a health care provider about:  Any allergies you have.  All medicines you are taking, including vitamins, herbs, eye drops, creams, and over-the-counter medicines.  Any problems you or family members have had with anesthetic medicines.  Any blood disorders you have.  Any surgeries you have had.  Any medical conditions you have.  Whether you are pregnant or may be pregnant.  Any past pregnancies. What are the risks? Generally, this is a safe procedure. However, problems may occur, including:  Infection.  Bleeding.  Injury to surrounding organs.  Side effects from anesthetics.  Failure of the procedure.  This procedure can increase your risk of a kind of pregnancy in which a fertilized egg attaches to the outside of the uterus (ectopic pregnancy). What happens before the procedure?  Ask your health care provider about: ? Changing or stopping your regular medicines. This is especially important if you are taking diabetes medicines or blood thinners. ? Taking medicines such as aspirin and ibuprofen. These medicines can thin your blood. Do not take these medicines before your procedure if your health care provider instructs you not to.  Follow instructions from your health care provider about eating and drinking restrictions.  Plan to have someone take you home after the procedure.  If you go home right after the procedure, plan to have someone with you for 24 hours. What happens during the  procedure?  You will be given one or more of the following: ? A medicine to help you relax (sedative). ? A medicine to numb the area (local anesthetic). ? A medicine to make you fall asleep (general anesthetic). ? A medicine that is injected into an area of your body to numb everything below the injection site (regional anesthetic).  An IV tube will be inserted into one of your veins. It will be used to give you medicines and fluids during the procedure.  Your bladder may be emptied with a small tube (catheter).  If you have been given a general anesthetic, a tube will be put down your throat to help you breathe.  Two small cuts (incisions) will be made in your lower abdomen and near your belly button.  Your abdomen will be inflated with a gas. This will let the surgeon see better and will give the surgeon room to work.  A thin, lighted tube (laparoscope) with a camera attached will be inserted into your abdomen through one of the incisions. Small instruments will be inserted through the other incision.  The fallopian tubes will be tied off, burned (cauterized), or blocked with a clip, ring, or clamp. A small portion in the center of each fallopian tube may be removed.  The gas will be released from the abdomen.  The incisions will be closed with stitches (sutures).  A bandage (dressing) will be placed over the incisions. The procedure may vary among health care providers and hospitals. What happens after the procedure?  Your blood pressure, heart rate, breathing rate, and blood oxygen level will be monitored often until the medicines you   were given have worn off.  You will be given medicine to help with pain, nausea, and vomiting as needed. This information is not intended to replace advice given to you by your health care provider. Make sure you discuss any questions you have with your health care provider. Document Released: 10/31/2000 Document Revised: 12/31/2015 Document  Reviewed: 07/05/2015 Elsevier Interactive Patient Education  2018 Elsevier Inc.  

## 2017-05-01 NOTE — Progress Notes (Signed)
Subjective:   Emily Hatfield is a 26 y.o. [id number] Caucasian female here for a routine well-woman exam.  No LMP recorded.    Current complaints: wants tubes tied, is 100% certain she does not want anymore children.  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  Past Medical History Past Medical History:  Diagnosis Date  . Medical history non-contributory   . Nausea and vomiting during pregnancy prior to [redacted] weeks gestation 08/18/2014  . Pregnant 08/18/2014    Past Surgical History Past Surgical History:  Procedure Laterality Date  . CHOLECYSTECTOMY N/A 07/21/2016   Procedure: LAPAROSCOPIC CHOLECYSTECTOMY;  Surgeon: Ancil Linsey, MD;  Location: [facility];  Service: General;  Laterality: N/A;  . WISDOM TOOTH EXTRACTION      Gynecologic History [id number]  No LMP recorded. Contraception: OCP (estrogen/progesterone) Last Pap: 09/11/14. Results were: normal Last mammogram: never. Results were: n/a Last TCS: never  Obstetric History OB History  Gravida Para Term Preterm AB Living  SAB TAB Ectopic Multiple Live Births  1     0 2    # Outcome Date GA Lbr Len/2nd Weight Sex Delivery Anes PTL Lv  3 Term 03/04/15 [redacted]w[redacted]d / 00:51 8 lb 7.3 oz (3.835 kg) M Vag-Spont None  LIV     Birth Comments: na   2 Term 03/18/10 [redacted]w[redacted]d  7 lb 11 oz (3.487 kg) F Vag-Spont EPI  LIV  1 SAB               Current Medications Current Outpatient Prescriptions on File Prior to Visit  Medication Sig Dispense Refill  . acetaminophen  (TYLENOL) 325 MG tablet Take 975 mg by mouth daily as needed for moderate pain or headache.    . norgestimate-ethinyl estradiol (SPRINTEC 28) 0.25-35 MG-MCG tablet Take 1 tablet by mouth daily. 28 tablet 0  . Multiple Vitamins-Minerals (EMERGEN-C IMMUNE PO) Take 1 packet by mouth daily as needed (immune support).     No current facility-administered medications on file prior to visit.     Review of Systems Patient denies any headaches, blurred vision, shortness of breath, chest pain, abdominal pain, problems with bowel movements, urination, or intercourse.  Objective:  There were no vitals taken for this visit. Physical Exam  General:  Well developed, well nourished, no acute distress. She is alert and oriented x3. Skin:  Warm and dry Neck:  Midline trachea, no thyromegaly or nodules Cardiovascular: Regular rate and rhythm, no murmur heard Lungs:  Effort normal, all lung fields clear to auscultation bilaterally Breasts:  No dominant palpable mass, retraction, or nipple discharge Abdomen:  Soft, non tender, no hepatosplenomegaly or masses Pelvic:  External genitalia is normal in appearance.  The vagina is normal in appearance. The cervix is bulbous, no CMT.  Thin prep pap is done w/ reflex HR HPV cotesting. Uterus is felt to be normal size, shape, and contour.  No adnexal masses or tenderness noted. Extremities:  No swelling or varicosities noted Psych:  She has a normal mood and affect  Assessment:   Healthy well-woman exam Desires  BTL, wants refill on coc's for right now Contraception management Occ smoker  Plan:  Refilled sprintec x 53yr just in case does not follow through w/ BTL F/U 3wks for pre-op visit w/ MD for BTL, or sooner if needed Advised complete smoking cessation Mammogram  or sooner if problems Colonoscopy  or sooner if problems  Marge Duncans CNM, [facility] 05/01/2017 11:57 AM

## 2017-05-03 LAB — CYTOLOGY - PAP
CHLAMYDIA, DNA PROBE: NEGATIVE
DIAGNOSIS: NEGATIVE
NEISSERIA GONORRHEA: NEGATIVE

## 2017-05-22 ENCOUNTER — Encounter: Payer: BLUE CROSS/BLUE SHIELD | Admitting: Obstetrics and Gynecology

## 2018-04-16 ENCOUNTER — Other Ambulatory Visit: Payer: Self-pay | Admitting: Women's Health

## 2018-04-27 ENCOUNTER — Telehealth: Payer: Self-pay | Admitting: Women's Health

## 2018-04-27 MED ORDER — NORGESTIMATE-ETH ESTRADIOL 0.25-35 MG-MCG PO TABS: 1.0000 | ORAL_TABLET | Freq: Every day | ORAL | 2 refills | Status: DC

## 2018-04-27 NOTE — Telephone Encounter (Signed)
Will refill OCs 

## 2018-05-31 ENCOUNTER — Encounter: Payer: Self-pay | Admitting: Adult Health

## 2018-05-31 ENCOUNTER — Ambulatory Visit (INDEPENDENT_AMBULATORY_CARE_PROVIDER_SITE_OTHER): Payer: BLUE CROSS/BLUE SHIELD | Admitting: Adult Health

## 2018-05-31 VITALS — BP 115/67 | HR 55 | Ht 66.0 in | Wt 179.0 lb

## 2018-05-31 DIAGNOSIS — Z3041 Encounter for surveillance of contraceptive pills: Secondary | ICD-10-CM

## 2018-05-31 DIAGNOSIS — Z01419 Encounter for gynecological examination (general) (routine) without abnormal findings: Secondary | ICD-10-CM

## 2018-05-31 MED ORDER — NORGESTIMATE-ETH ESTRADIOL 0.25-35 MG-MCG PO TABS: 1.0000 | ORAL_TABLET | Freq: Every day | ORAL | 4 refills | Status: DC

## 2018-05-31 NOTE — Progress Notes (Addendum)
Patient ID: Emily Hatfield, female   DOB: 12/29/1990, 27 y.o.   MRN: [id number] History of Present Illness: Emily Hatfield is a 27 year old white female, married in for a well woman gyn exam, she had a normal pap 05/01/17.She works for [employer] in [place].  No PCP.   Current Medications, Allergies, Past Medical History, Past Surgical History, Family History and Social History were reviewed in [employer] record.     Review of Systems:  Patient denies any headaches, hearing loss, fatigue, blurred vision, shortness of breath, chest pain, abdominal pain, problems with bowel movements, urination, or intercourse. No joint pain or mood swings.   Physical Exam:BP 115/67 (BP Location: Left Arm, Patient Position: Sitting, Cuff Size: Normal)   Pulse (!) 55   Ht 5\' 6"  (1.676 m)   Wt 179 lb (81.2 kg)   LMP 05/05/2018   BMI 28.89 kg/m  General:  Well developed, well nourished, no acute distress Skin:  Warm and dry Neck:  Midline trachea, normal thyroid, good ROM, no lymphadenopathy Lungs; Clear to auscultation bilaterally Breast:  No dominant palpable mass, retraction, or nipple discharge,both nipples inverted and she says that is normal for her Cardiovascular: Regular rate and rhythm Abdomen:  Soft, non tender, no hepatosplenomegaly Pelvic:  External genitalia is normal in appearance, no lesions.  The vagina is normal in appearance. Urethra has no lesions or masses. The cervix is bulbous.  Uterus is felt to be normal size, shape, and contour.  No adnexal masses or tenderness noted.Bladder is non tender, no masses felt. Extremities/musculoskeletal:  No swelling or varicosities noted, no clubbing or cyanosis Psych:  No mood changes, alert and cooperative,seems happy PHQ 9 score 6, has had rough 2 weeks, but denies being suicidal, husband is out of work. Examination chaperoned by Federico Flake CMA.  Impression: 1. Encounter for well woman exam with routine gynecological exam   2.  Encounter for surveillance of contraceptive pills       Plan:  Meds ordered this encounter  Medications  . norgestimate-ethinyl estradiol (SPRINTEC 28) 0.25-35 MG-MCG tablet    Sig: Take 1 tablet by mouth daily.    Dispense:  84 tablet    Refill:  4    Order Specific Question:   Supervising Provider    Answer:   Lazaro Arms [[id number]]  Physical in 1 year Pap in 2021

## 2019-06-08 ENCOUNTER — Other Ambulatory Visit: Payer: Self-pay | Admitting: Adult Health

## 2019-06-18 ENCOUNTER — Encounter: Payer: Self-pay | Admitting: Adult Health

## 2019-06-18 ENCOUNTER — Ambulatory Visit: Payer: BC Managed Care – PPO | Admitting: Adult Health

## 2019-06-18 ENCOUNTER — Other Ambulatory Visit: Payer: Self-pay

## 2019-06-18 VITALS — BP 119/77 | HR 61 | Ht 66.0 in | Wt 186.5 lb

## 2019-06-18 DIAGNOSIS — Z3041 Encounter for surveillance of contraceptive pills: Secondary | ICD-10-CM | POA: Insufficient documentation

## 2019-06-18 MED ORDER — NORGESTIMATE-ETH ESTRADIOL 0.25-35 MG-MCG PO TABS: 1.0000 | ORAL_TABLET | Freq: Every day | ORAL | 12 refills | Status: DC

## 2019-06-18 NOTE — Progress Notes (Signed)
Patient ID: Emily Hatfield, female   DOB: 1991-07-26, 28 y.o.   MRN: [id number] History of Present Illness: Emily Hatfield is a 28 year old white female, married, [id number], in to get refills on Sprintec, which is working well.    Current Medications, Allergies, Past Medical History, Past Surgical History, Family History and Social History were reviewed in [employer] record.     Review of Systems: Patient denies any headaches, hearing loss, fatigue, blurred vision, shortness of breath, chest pain, abdominal pain, problems with bowel movements, urination, or intercourse. No joint pain or mood swings.    Physical Exam:BP 119/77 (BP Location: Left Arm, Patient Position: Sitting, Cuff Size: Normal)   Pulse 61   Ht 5\' 6"  (1.676 m)   Wt 186 lb 8 oz (84.6 kg)   LMP 06/08/2019 (Approximate)   BMI 30.10 kg/m  General:  Well developed, well nourished, no acute distress Skin:  Warm and dry Neck:  Midline trachea, normal thyroid, good ROM, no lymphadenopathy Lungs; Clear to auscultation bilaterally Cardiovascular: Regular rate and rhythm Psych:  No mood changes, alert and cooperative,seems happy Fall risk is low  Impression and Plan:  1. Encounter for surveillance of contraceptive pills Will refill Sprintec Meds ordered this encounter  Medications  . norgestimate-ethinyl estradiol (SPRINTEC 28) 0.25-35 MG-MCG tablet    Sig: Take 1 tablet by mouth daily.    Dispense:  28 tablet    Refill:  12    Order Specific Question:   Supervising Provider    Answer:   Tania Ade H [[id number]]   Pap and physical in 1 year Apply for [facility], may be losing insurance

## 2020-06-11 ENCOUNTER — Telehealth: Payer: Self-pay

## 2020-06-11 ENCOUNTER — Other Ambulatory Visit: Payer: Self-pay | Admitting: Women's Health

## 2020-06-11 MED ORDER — NORGESTIMATE-ETH ESTRADIOL 0.25-35 MG-MCG PO TABS: 1.0000 | ORAL_TABLET | Freq: Every day | ORAL | 1 refills | Status: DC

## 2020-06-11 NOTE — Telephone Encounter (Signed)
Pt needing one month supply of BC to hold her over until her appointment with Victorino Dike on 12/08.

## 2020-06-18 ENCOUNTER — Other Ambulatory Visit: Payer: BC Managed Care – PPO | Admitting: Adult Health

## 2020-06-23 ENCOUNTER — Other Ambulatory Visit: Payer: BC Managed Care – PPO | Admitting: Adult Health

## 2020-07-15 ENCOUNTER — Other Ambulatory Visit: Payer: BC Managed Care – PPO | Admitting: Adult Health

## 2020-08-13 ENCOUNTER — Other Ambulatory Visit: Payer: Self-pay

## 2020-08-13 ENCOUNTER — Encounter: Payer: Self-pay | Admitting: Adult Health

## 2020-08-13 ENCOUNTER — Ambulatory Visit (INDEPENDENT_AMBULATORY_CARE_PROVIDER_SITE_OTHER): Payer: BC Managed Care – PPO | Admitting: Adult Health

## 2020-08-13 ENCOUNTER — Other Ambulatory Visit (HOSPITAL_COMMUNITY)
Admission: RE | Admit: 2020-08-13 | Discharge: 2020-08-13 | Disposition: A | Discharge status: Home / Self Care | Payer: BC Managed Care – PPO | Source: Ambulatory Visit | Attending: Adult Health | Admitting: Adult Health

## 2020-08-13 VITALS — BP 121/66 | HR 63 | Ht 64.25 in | Wt 194.0 lb

## 2020-08-13 DIAGNOSIS — Z01419 Encounter for gynecological examination (general) (routine) without abnormal findings: Secondary | ICD-10-CM | POA: Diagnosis not present

## 2020-08-13 DIAGNOSIS — Z3041 Encounter for surveillance of contraceptive pills: Secondary | ICD-10-CM

## 2020-08-13 DIAGNOSIS — F419 Anxiety disorder, unspecified: Secondary | ICD-10-CM | POA: Diagnosis not present

## 2020-08-13 DIAGNOSIS — Z3009 Encounter for other general counseling and advice on contraception: Secondary | ICD-10-CM

## 2020-08-13 MED ORDER — NORGESTIMATE-ETH ESTRADIOL 0.25-35 MG-MCG PO TABS: 1.0000 | ORAL_TABLET | Freq: Every day | ORAL | 12 refills | Status: DC

## 2020-08-13 MED ORDER — HYDROXYZINE HCL 10 MG PO TABS: 10.0000 mg | ORAL_TABLET | Freq: Three times a day (TID) | ORAL | 1 refills | Status: DC | PRN

## 2020-08-13 NOTE — Progress Notes (Addendum)
Patient ID: Emily Hatfield, female   DOB: Oct 23, 1990, 30 y.o.   MRN: [id number] History of Present Illness: Emily Hatfield is a 30 year old white female,married, [id number], In for a well woman gyn exam and pap.She is thinking of getting tubal ligation.    Current Medications, Allergies, Past Medical History, Past Surgical History, Family History and Social History were reviewed in [employer] record.     Review of Systems:  Patient denies any headaches, hearing loss, fatigue, blurred vision, shortness of breath, chest pain, abdominal pain, problems with bowel movements, urination, or intercourse. No joint pain or mood swings. Had pain under right breast yesterday,not now had COVID in December and has occasional cough and pain over sternum at times  Physical Exam:BP 121/66 (BP Location: Left Arm, Patient Position: Sitting, Cuff Size: Normal)   Pulse 63   Ht 5' 4.25" (1.632 m)   Wt 194 lb (88 kg)   LMP 07/30/2020   BMI 33.04 kg/m  General:  Well developed, well nourished, no acute distress Skin:  Warm and dry Neck:  Midline trachea, normal thyroid, good ROM, no lymphadenopathy Lungs; Clear to auscultation bilaterally Breast:  No dominant palpable mass, retraction, or nipple discharge Cardiovascular: Regular rate and rhythm Abdomen:  Soft, non tender, no hepatosplenomegaly Pelvic:  External genitalia is normal in appearance, no lesions.  The vagina is normal in appearance. Urethra has no lesions or masses. The cervix is bulbous.Pap with HRHPV genotyping performed.  Uterus is felt to be normal size, shape, and contour.  No adnexal masses or tenderness noted.Bladder is non tender, no masses felt. Extremities/musculoskeletal:  No swelling or varicosities noted, no clubbing or cyanosis Psych:  No mood changes, alert and cooperative,seems happy AA is 0 Fall risk is low PHQ 9 score is 8, no SI, not really depressed, feels overwhelmed and anxious,just can't relax GAD 7 score is 10,  will try vistaril, doesn't want pill every day  [facility] - 08/13/20 0955      Pregnancy Intention Screening   Does the patient want to become pregnant in the next year? No    Does the patient's partner want to become pregnant in the next year? No    Would the patient like to discuss contraceptive options today? No      Contraception Wrap Up   Current Method Oral Contraceptive    End Method Oral Contraceptive   wants to get tubal in future   Contraception Counseling Provided Yes         Examination chaperoned by Federico Flake CMA   Impression and Plan: 1. Encounter for gynecological examination with Papanicolaou smear of cervix Pap sent Physical in 1 year Pap in 3 if normal Try to decrease smoking She declines labs today   2. Encounter for surveillance of contraceptive pills Continue sprintec for now Meds ordered this encounter  Medications  . norgestimate-ethinyl estradiol (SPRINTEC 28) 0.25-35 MG-MCG tablet    Sig: Take 1 tablet by mouth daily.    Dispense:  28 tablet    Refill:  12    Order Specific Question:   Supervising Provider    Answer:   Despina Hidden, LUTHER H [[id number]]  . hydrOXYzine (ATARAX/VISTARIL) 10 MG tablet    Sig: Take 1 tablet (10 mg total) by mouth 3 (three) times daily as needed.    Dispense:  30 tablet    Refill:  1    Order Specific Question:   Supervising Provider    Answer:   Duane Lope H [[id number]]  3. General counseling and advice on contraceptive management Discussed tubal and handout given by Krames  Return in about 4 months to see Dr Despina Hidden about getting tubal  4. Anxiety Will try vistaril prn Ask for help from family  She will let me know if helps

## 2020-08-19 LAB — CYTOLOGY - PAP
Comment: NEGATIVE
Diagnosis: NEGATIVE
High risk HPV: NEGATIVE

## 2020-09-07 ENCOUNTER — Other Ambulatory Visit: Payer: Self-pay | Admitting: Adult Health

## 2020-12-07 ENCOUNTER — Ambulatory Visit: Payer: BC Managed Care – PPO | Admitting: Obstetrics & Gynecology

## 2021-01-07 ENCOUNTER — Ambulatory Visit: Payer: BC Managed Care – PPO | Admitting: Obstetrics & Gynecology

## 2021-03-09 ENCOUNTER — Ambulatory Visit: Payer: BC Managed Care – PPO | Admitting: Obstetrics & Gynecology

## 2021-04-15 ENCOUNTER — Encounter: Payer: Self-pay | Admitting: Obstetrics & Gynecology

## 2021-04-15 ENCOUNTER — Other Ambulatory Visit: Payer: Self-pay

## 2021-04-15 ENCOUNTER — Ambulatory Visit: Payer: BC Managed Care – PPO | Admitting: Obstetrics & Gynecology

## 2021-04-15 VITALS — BP 115/71 | HR 60 | Ht 66.0 in | Wt 194.0 lb

## 2021-04-15 DIAGNOSIS — Z3009 Encounter for other general counseling and advice on contraception: Secondary | ICD-10-CM

## 2021-04-15 MED ORDER — HYDROXYZINE HCL 10 MG PO TABS: 10.0000 mg | ORAL_TABLET | Freq: Three times a day (TID) | ORAL | 1 refills | Status: DC | PRN

## 2021-04-15 NOTE — Progress Notes (Unsigned)
Emily Hatfield is a 30 y.o. [phone] with Patient's last menstrual period was 03/12/2021. admitted for a ***.  ***  PMH:    Past Medical History:  Diagnosis Date   Medical history non-contributory    Mental disorder    Nausea and vomiting during pregnancy prior to [redacted] weeks gestation 08/18/2014   Pregnant 08/18/2014    PSH:     Past Surgical History:  Procedure Laterality Date   CHOLECYSTECTOMY N/A 07/21/2016   Procedure: LAPAROSCOPIC CHOLECYSTECTOMY;  Surgeon: Ancil Linsey, MD;  Location: [facility];  Service: General;  Laterality: N/A;   WISDOM TOOTH EXTRACTION      POb/GynH:      OB History     Gravida  3   Para  2   Term  2   Preterm      AB  1   Living  2      SAB  1   IAB      Ectopic      Multiple  0   Live Births  2           SH:   Social History   Tobacco Use   Smoking status: Some Days    Types: Cigars, E-cigarettes   Smokeless tobacco: Never  Vaping Use   Vaping Use: Some days  Substance Use Topics   Alcohol use: No   Drug use: No    FH:    Family History  Problem Relation Age of Onset   Stroke Paternal Grandmother    Heart disease Maternal Grandmother    Cancer Maternal Grandfather        lung   Hypertension Mother    Diabetes Mother        borderline     Allergies:  Allergies  Allergen Reactions   Penicillins Hives, Itching and Rash    Has patient had a PCN reaction causing immediate rash, facial/tongue/throat swelling, SOB or lightheadedness with hypotension: Yes Has patient had a PCN reaction causing severe rash involving mucus membranes or skin necrosis: Yes Has patient had a PCN reaction that required hospitalization No Has patient had a PCN reaction occurring within the last 10 years: Yes If all of the above answers are "NO", then may proceed with Cephalosporin use.     Medications:       Current Outpatient Medications:    norgestimate-ethinyl estradiol (SPRINTEC 28)  0.25-35 MG-MCG tablet, Take 1 tablet by mouth daily., Disp: 28 tablet, Rfl: 12   acetaminophen (TYLENOL) 325 MG tablet, Take 975 mg by mouth daily as needed for moderate pain or headache. (Patient not taking: Reported on 04/15/2021), Disp: , Rfl:    Aspirin-Salicylamide-Caffeine (BC HEADACHE POWDER PO), Take by mouth as needed. (Patient not taking: Reported on 04/15/2021), Disp: , Rfl:    hydrOXYzine (ATARAX/VISTARIL) 10 MG tablet, Take 1 tablet (10 mg total) by mouth 3 (three) times daily as needed., Disp: 30 tablet, Rfl: 1  Review of Systems:   Review of Systems  Constitutional: Negative for fever, chills, weight loss, malaise/fatigue and diaphoresis.  HENT: Negative for hearing loss, ear pain, nosebleeds, congestion, sore throat, neck pain, tinnitus and ear discharge.   Eyes: Negative for blurred vision, double vision, photophobia, pain, discharge and redness.  Respiratory: Negative for cough, hemoptysis, sputum production, shortness of breath, wheezing and stridor.   Cardiovascular: Negative for chest pain, palpitations, orthopnea, claudication, leg swelling and PND.  Gastrointestinal: Positive for abdominal pain. Negative for heartburn, nausea, vomiting,  diarrhea, constipation, blood in stool and melena.  Genitourinary: Negative for dysuria, urgency, frequency, hematuria and flank pain.  Musculoskeletal: Negative for myalgias, back pain, joint pain and falls.  Skin: Negative for itching and rash.  Neurological: Negative for dizziness, tingling, tremors, sensory change, speech change, focal weakness, seizures, loss of consciousness, weakness and headaches.  Endo/Heme/Allergies: Negative for environmental allergies and polydipsia. Does not bruise/bleed easily.  Psychiatric/Behavioral: Negative for depression, suicidal ideas, hallucinations, memory loss and substance abuse. The patient is not nervous/anxious and does not have insomnia.      PHYSICAL EXAM:  Blood pressure 115/71, pulse 60,  height 5\' 6"  (1.676 m), weight 194 lb (88 kg), last menstrual period 03/12/2021.    Vitals reviewed. Constitutional: She is oriented to person, place, and time. She appears well-developed and well-nourished.  HENT:  Head: Normocephalic and atraumatic.  Right Ear: External ear normal.  Left Ear: External ear normal.  Nose: Nose normal.  Mouth/Throat: Oropharynx is clear and moist.  Eyes: Conjunctivae and EOM are normal. Pupils are equal, round, and reactive to light. Right eye exhibits no discharge. Left eye exhibits no discharge. No scleral icterus.  Neck: Normal range of motion. Neck supple. No tracheal deviation present. No thyromegaly present.  Cardiovascular: Normal rate, regular rhythm, normal heart sounds and intact distal pulses.  Exam reveals no gallop and no friction rub.   No murmur heard. Respiratory: Effort normal and breath sounds normal. No respiratory distress. She has no wheezes. She has no rales. She exhibits no tenderness.  GI: Soft. Bowel sounds are normal. She exhibits no distension and no mass. There is tenderness. There is no rebound and no guarding.  Genitourinary:       Vulva is normal without lesions Vagina is pink moist without discharge Cervix normal in appearance and pap is normal Uterus is {exam; uterus:12215} Adnexa is negative with normal sized ovaries by sonogram  Musculoskeletal: Normal range of motion. She exhibits no edema and no tenderness.  Neurological: She is alert and oriented to person, place, and time. She has normal reflexes. She displays normal reflexes. No cranial nerve deficit. She exhibits normal muscle tone. Coordination normal.  Skin: Skin is warm and dry. No rash noted. No erythema. No pallor.  Psychiatric: She has a normal mood and affect. Her behavior is normal. Judgment and thought content normal.    Labs: No results found for this or any previous visit (from the past 336 hour(s)).  EKG: No orders found for this or any previous  visit.  Imaging Studies: No results found.    Assessment: Patient Active Problem List   Diagnosis Date Noted   General counseling and advice on contraceptive management 08/13/2020   Encounter for gynecological examination with Papanicolaou smear of cervix 08/13/2020   Encounter for surveillance of contraceptive pills 06/18/2019   Smoker 05/01/2017   Varicosities of leg 01/06/2015    Plan: 01/08/2015 H Valeska Haislip 04/15/2021 10:20 AM

## 2021-05-21 DIAGNOSIS — Z029 Encounter for administrative examinations, unspecified: Secondary | ICD-10-CM

## 2021-06-03 NOTE — Patient Instructions (Signed)
Emily Hatfield  06/03/2021     @PREFPERIOPPHARMACY @   Your procedure is scheduled on 06/09/2021.   Report to 13/09/2020 at  [place] [facility].    Call this number if you have problems the morning of surgery:  [phone]   Remember:  Do not eat after midnight.   You may drink clear liquids until  0550 AM.  At 0550 AM, drink your carb drink. You cannot have anything else to drink after this.     Clear liquids allowed are:                    Water, Juice (non-citric and without pulp - diabetics please choose diet or no sugar options), Carbonated beverages - (diabetics please choose diet or no sugar options), Clear Tea, Black Coffee only (no creamer, milk or cream including half and half), Plain Jell-O only (diabetics please choose diet or no sugar options), Gatorade (diabetics please choose diet or no sugar options), and Plain Popsicles only     Take these medicines the morning of surgery with A SIP OF WATER                           hydroxyzine.     Do not wear jewelry, make-up or nail polish.  Do not wear lotions, powders, or perfumes, or deodorant.  Do not shave 48 hours prior to surgery.  Men may shave face and neck.  Do not bring valuables to the hospital.  [facility] is not responsible for any belongings or valuables.  Contacts, dentures or bridgework may not be worn into surgery.  Leave your suitcase in the car.  After surgery it may be brought to your room.  For patients admitted to the hospital, discharge time will be determined by your treatment team.  Patients discharged the day of surgery will not be allowed to drive home and must have someone with them for 24 hours.    Special instructions:   DO NOT smoke tobacco or vape for 24 hours before your procedure.  Please read over the following fact sheets that you were given. Anesthesia Post-op Instructions and Care and Recovery After Surgery      Salpingectomy, Care After The following information offers  guidance on how to care for yourself after your procedure. Your health care provider may also give you more specific instructions. If you have problems or questions, contact your health care provider. What can I expect after the procedure? After the procedure, it is common to have: Pain in your abdomen. Light vaginal bleeding (spotting) for a few days. Tiredness. Your recovery time will depend on which method was used for your surgery. Follow these instructions at home: Medicines Take over-the-counter and prescription medicines only as told by your health care provider. Ask your health care provider if the medicine prescribed to you: Requires you to avoid driving or using machinery. Can cause constipation. You may need to take actions to prevent or treat constipation, such as: Drink enough fluid to keep your urine pale yellow. Take over-the-counter or prescription medicines. Eat foods that are high in fiber, such as beans, whole grains, and fresh fruits and vegetables. Limit foods that are high in fat and processed sugars, such as fried or sweet foods. Incision care  Follow instructions from your health care provider about how to take care of your incision or incisions. Make sure you: Wash your hands  with soap and water for at least 20 seconds before and after you change your bandage (dressing). If soap and water are not available, use hand sanitizer. Change or remove your dressing as told by your health care provider. Leave stitches (sutures), skin glue, staples, or adhesive strips in place. These skin closures may need to stay in place for 2 weeks or longer. If adhesive strip edges start to loosen and curl up, you may trim the loose edges. Do not remove adhesive strips completely unless your health care provider tells you to do that. Keep your dressing clean and dry. Check your incision area every day for signs of infection. Check for: Redness, swelling, or pain that gets worse. Fluid or  blood. Warmth. Pus or a bad smell. Activity Rest as told by your health care provider. Avoid sitting for a long time without moving. Get up to take short walks every 1-2 hours. This is important to improve blood flow and breathing. Ask for help if you feel weak or unsteady. Return to your normal activities as told by your health care provider. Ask your health care provider what activities are safe for you. Do not drive until your health care provider says that it is safe. Do not lift anything that is heavier than 10 lb (4.5 kg), or the limit that you are told, until your health care provider says that it is safe. This may last for 2-6 weeks depending on your surgery. Do not douche, use tampons, or have sex until your health care provider approves. General instructions Do not use any products that contain nicotine or tobacco. These products include cigarettes, chewing tobacco, and vaping devices, such as e-cigarettes. These can delay healing after surgery. If you need help quitting, ask your health care provider. Wear compression stockings as told by your health care provider. These stockings help to prevent blood clots and reduce swelling in your legs. Do not take baths, swim, or use a hot tub until your health care provider approves. You may take showers. Keep all follow-up visits. This is important. Contact a health care provider if: You have pain when you urinate. You have redness, swelling, or more pain around an incision or an incision feels warm to the touch. You have pus, fluid, blood, or a bad smell coming from an incision or an incision starts to open. You have a fever. You have abdominal pain that gets worse or does not get better with medicine. You have a rash. You feel light-headed, have nausea and vomiting, or both. Get help right away if: You have pain in your chest or leg. You develop shortness of breath. You faint. You have increased or heavy vaginal bleeding, such as  soaking a sanitary napkin in an hour. These symptoms may represent a serious problem that is an emergency. Do not wait to see if the symptoms will go away. Get medical help right away. Call your local emergency services (911 in the [place] Do not drive yourself to the hospital. Summary After the procedure, it is common to feel tired, have pain in your abdomen, and have light vaginal bleeding for a few days. Follow instructions from your health care provider about how to take care of your incision or incisions. Return to your normal activities as told by your health care provider. Ask your health care provider what activities are safe for you. Do not douche, use tampons, or have sex until your health care provider approves. Keep all follow-up visits. This is important. This  information is not intended to replace advice given to you by your health care provider. Make sure you discuss any questions you have with your health care provider. Document Revised: 06/16/2020 Document Reviewed: 06/16/2020 Elsevier Patient Education  2022 [employer] General Anesthesia, Adult, Care After This sheet gives you information about how to care for yourself after your procedure. Your health care provider may also give you more specific instructions. If you have problems or questions, contact your health care provider. What can I expect after the procedure? After the procedure, the following side effects are common: Pain or discomfort at the IV site. Nausea. Vomiting. Sore throat. Trouble concentrating. Feeling cold or chills. Feeling weak or tired. Sleepiness and fatigue. Soreness and body aches. These side effects can affect parts of the body that were not involved in surgery. Follow these instructions at home: For the time period you were told by your health care provider:  Rest. Do not participate in activities where you could fall or become injured. Do not drive or use machinery. Do not drink  alcohol. Do not take sleeping pills or medicines that cause drowsiness. Do not make important decisions or sign legal documents. Do not take care of children on your own. Eating and drinking Follow any instructions from your health care provider about eating or drinking restrictions. When you feel hungry, start by eating small amounts of foods that are soft and easy to digest (bland), such as toast. Gradually return to your regular diet. Drink enough fluid to keep your urine pale yellow. If you vomit, rehydrate by drinking water, juice, or clear broth. General instructions If you have sleep apnea, surgery and certain medicines can increase your risk for breathing problems. Follow instructions from your health care provider about wearing your sleep device: Anytime you are sleeping, including during daytime naps. While taking prescription pain medicines, sleeping medicines, or medicines that make you drowsy. Have a responsible adult stay with you for the time you are told. It is important to have someone help care for you until you are awake and alert. Return to your normal activities as told by your health care provider. Ask your health care provider what activities are safe for you. Take over-the-counter and prescription medicines only as told by your health care provider. If you smoke, do not smoke without supervision. Keep all follow-up visits as told by your health care provider. This is important. Contact a health care provider if: You have nausea or vomiting that does not get better with medicine. You cannot eat or drink without vomiting. You have pain that does not get better with medicine. You are unable to pass urine. You develop a skin rash. You have a fever. You have redness around your IV site that gets worse. Get help right away if: You have difficulty breathing. You have chest pain. You have blood in your urine or stool, or you vomit blood. Summary After the procedure, it  is common to have a sore throat or nausea. It is also common to feel tired. Have a responsible adult stay with you for the time you are told. It is important to have someone help care for you until you are awake and alert. When you feel hungry, start by eating small amounts of foods that are soft and easy to digest (bland), such as toast. Gradually return to your regular diet. Drink enough fluid to keep your urine pale yellow. Return to your normal activities as told by your health care provider. Ask your  health care provider what activities are safe for you. This information is not intended to replace advice given to you by your health care provider. Make sure you discuss any questions you have with your health care provider. Document Revised: 04/09/2020 Document Reviewed: 11/07/2019 Elsevier Patient Education  2022 [employer] How to Use Chlorhexidine for Bathing Chlorhexidine gluconate (CHG) is a germ-killing (antiseptic) solution that is used to clean the skin. It can get rid of the bacteria that normally live on the skin and can keep them away for about 24 hours. To clean your skin with CHG, you may be given: A CHG solution to use in the shower or as part of a sponge bath. A prepackaged cloth that contains CHG. Cleaning your skin with CHG may help lower the risk for infection: While you are staying in the intensive care unit of the hospital. If you have a vascular access, such as a central line, to provide short-term or long-term access to your veins. If you have a catheter to drain urine from your bladder. If you are on a ventilator. A ventilator is a machine that helps you breathe by moving air in and out of your lungs. After surgery. What are the risks? Risks of using CHG include: A skin reaction. Hearing loss, if CHG gets in your ears and you have a perforated eardrum. Eye injury, if CHG gets in your eyes and is not rinsed out. The CHG product catching fire. Make sure that you  avoid smoking and flames after applying CHG to your skin. Do not use CHG: If you have a chlorhexidine allergy or have previously reacted to chlorhexidine. On babies younger than 82 months of age. How to use CHG solution Use CHG only as told by your health care provider, and follow the instructions on the label. Use the full amount of CHG as directed. Usually, this is one bottle. During a shower Follow these steps when using CHG solution during a shower (unless your health care provider gives you different instructions): Start the shower. Use your normal soap and shampoo to wash your face and hair. Turn off the shower or move out of the shower stream. Pour the CHG onto a clean washcloth. Do not use any type of brush or rough-edged sponge. Starting at your neck, lather your body down to your toes. Make sure you follow these instructions: If you will be having surgery, pay special attention to the part of your body where you will be having surgery. Scrub this area for at least 1 minute. Do not use CHG on your head or face. If the solution gets into your ears or eyes, rinse them well with water. Avoid your genital area. Avoid any areas of skin that have broken skin, cuts, or scrapes. Scrub your back and under your arms. Make sure to wash skin folds. Let the lather sit on your skin for 1-2 minutes or as long as told by your health care provider. Thoroughly rinse your entire body in the shower. Make sure that all body creases and crevices are rinsed well. Dry off with a clean towel. Do not put any substances on your body afterward--such as powder, lotion, or perfume--unless you are told to do so by your health care provider. Only use lotions that are recommended by the manufacturer. Put on clean clothes or pajamas. If it is the night before your surgery, sleep in clean sheets.  During a sponge bath Follow these steps when using CHG solution during a sponge bath (  unless your health care provider  gives you different instructions): Use your normal soap and shampoo to wash your face and hair. Pour the CHG onto a clean washcloth. Starting at your neck, lather your body down to your toes. Make sure you follow these instructions: If you will be having surgery, pay special attention to the part of your body where you will be having surgery. Scrub this area for at least 1 minute. Do not use CHG on your head or face. If the solution gets into your ears or eyes, rinse them well with water. Avoid your genital area. Avoid any areas of skin that have broken skin, cuts, or scrapes. Scrub your back and under your arms. Make sure to wash skin folds. Let the lather sit on your skin for 1-2 minutes or as long as told by your health care provider. Using a different clean, wet washcloth, thoroughly rinse your entire body. Make sure that all body creases and crevices are rinsed well. Dry off with a clean towel. Do not put any substances on your body afterward--such as powder, lotion, or perfume--unless you are told to do so by your health care provider. Only use lotions that are recommended by the manufacturer. Put on clean clothes or pajamas. If it is the night before your surgery, sleep in clean sheets. How to use CHG prepackaged cloths Only use CHG cloths as told by your health care provider, and follow the instructions on the label. Use the CHG cloth on clean, dry skin. Do not use the CHG cloth on your head or face unless your health care provider tells you to. When washing with the CHG cloth: Avoid your genital area. Avoid any areas of skin that have broken skin, cuts, or scrapes. Before surgery Follow these steps when using a CHG cloth to clean before surgery (unless your health care provider gives you different instructions): Using the CHG cloth, vigorously scrub the part of your body where you will be having surgery. Scrub using a back-and-forth motion for 3 minutes. The area on your body should  be completely wet with CHG when you are done scrubbing. Do not rinse. Discard the cloth and let the area air-dry. Do not put any substances on the area afterward, such as powder, lotion, or perfume. Put on clean clothes or pajamas. If it is the night before your surgery, sleep in clean sheets.  For general bathing Follow these steps when using CHG cloths for general bathing (unless your health care provider gives you different instructions). Use a separate CHG cloth for each area of your body. Make sure you wash between any folds of skin and between your fingers and toes. Wash your body in the following order, switching to a new cloth after each step: The front of your neck, shoulders, and chest. Both of your arms, under your arms, and your hands. Your stomach and groin area, avoiding the genitals. Your right leg and foot. Your left leg and foot. The back of your neck, your back, and your buttocks. Do not rinse. Discard the cloth and let the area air-dry. Do not put any substances on your body afterward--such as powder, lotion, or perfume--unless you are told to do so by your health care provider. Only use lotions that are recommended by the manufacturer. Put on clean clothes or pajamas. Contact a health care provider if: Your skin gets irritated after scrubbing. You have questions about using your solution or cloth. You swallow any chlorhexidine. Call your local poison control  center (1-203-781-5284 in the [place] Get help right away if: Your eyes itch badly, or they become very red or swollen. Your skin itches badly and is red or swollen. Your hearing changes. You have trouble seeing. You have swelling or tingling in your mouth or throat. You have trouble breathing. These symptoms may represent a serious problem that is an emergency. Do not wait to see if the symptoms will go away. Get medical help right away. Call your local emergency services (911 in the [place] Do not drive yourself to  the hospital. Summary Chlorhexidine gluconate (CHG) is a germ-killing (antiseptic) solution that is used to clean the skin. Cleaning your skin with CHG may help to lower your risk for infection. You may be given CHG to use for bathing. It may be in a bottle or in a prepackaged cloth to use on your skin. Carefully follow your health care provider's instructions and the instructions on the product label. Do not use CHG if you have a chlorhexidine allergy. Contact your health care provider if your skin gets irritated after scrubbing. This information is not intended to replace advice given to you by your health care provider. Make sure you discuss any questions you have with your health care provider. Document Revised: 10/05/2020 Document Reviewed: 10/05/2020 Elsevier Patient Education  2022 [employer].

## 2021-06-04 ENCOUNTER — Encounter (HOSPITAL_COMMUNITY)
Admission: RE | Admit: 2021-06-04 | Discharge: 2021-06-04 | Disposition: A | Discharge status: Home / Self Care | Payer: BC Managed Care – PPO | Source: Ambulatory Visit | Attending: Obstetrics & Gynecology | Admitting: Obstetrics & Gynecology

## 2021-06-04 ENCOUNTER — Encounter (HOSPITAL_COMMUNITY): Payer: Self-pay

## 2021-06-04 ENCOUNTER — Other Ambulatory Visit: Payer: Self-pay | Admitting: Obstetrics & Gynecology

## 2021-06-04 DIAGNOSIS — Z01818 Encounter for other preprocedural examination: Secondary | ICD-10-CM

## 2021-06-04 DIAGNOSIS — Z01812 Encounter for preprocedural laboratory examination: Secondary | ICD-10-CM | POA: Diagnosis present

## 2021-06-04 HISTORY — DX: Anxiety disorder, unspecified: F41.9

## 2021-06-04 LAB — URINALYSIS, ROUTINE W REFLEX MICROSCOPIC
Bilirubin Urine: NEGATIVE
Glucose, UA: NEGATIVE mg/dL
Hgb urine dipstick: NEGATIVE
Ketones, ur: NEGATIVE mg/dL
Leukocytes,Ua: NEGATIVE
Nitrite: NEGATIVE
Protein, ur: NEGATIVE mg/dL
Specific Gravity, Urine: 1.013 (ref 1.005–1.030)
pH: 6 (ref 5.0–8.0)

## 2021-06-04 LAB — CBC
HCT: 44.1 % (ref 36.0–46.0)
Hemoglobin: 14 g/dL (ref 12.0–15.0)
MCH: 29.2 pg (ref 26.0–34.0)
MCHC: 31.7 g/dL (ref 30.0–36.0)
MCV: 92.1 fL (ref 80.0–100.0)
Platelets: 260 10*3/uL (ref 150–400)
RBC: 4.79 MIL/uL (ref 3.87–5.11)
RDW: 13.2 % (ref 11.5–15.5)
WBC: 6.7 10*3/uL (ref 4.0–10.5)
nRBC: 0 % (ref 0.0–0.2)

## 2021-06-04 LAB — COMPREHENSIVE METABOLIC PANEL
ALT: 14 U/L (ref 0–44)
AST: 18 U/L (ref 15–41)
Albumin: 4.1 g/dL (ref 3.5–5.0)
Alkaline Phosphatase: 119 U/L (ref 38–126)
Anion gap: 6 (ref 5–15)
BUN: 9 mg/dL (ref 6–20)
CO2: 27 mmol/L (ref 22–32)
Calcium: 8.8 mg/dL — ABNORMAL LOW (ref 8.9–10.3)
Chloride: 103 mmol/L (ref 98–111)
Creatinine, Ser: 0.7 mg/dL (ref 0.44–1.00)
GFR, Estimated: >60 mL/min (ref >60)
Glucose, Bld: 84 mg/dL (ref 70–99)
Potassium: 3.5 mmol/L (ref 3.5–5.1)
Sodium: 136 mmol/L (ref 135–145)
Total Bilirubin: 0.9 mg/dL (ref 0.3–1.2)
Total Protein: 8.1 g/dL (ref 6.5–8.1)

## 2021-06-04 LAB — RAPID HIV SCREEN (HIV 1/2 AB+AG)
HIV 1/2 Antibodies: NONREACTIVE
HIV-1 P24 Antigen - HIV24: NONREACTIVE

## 2021-06-04 LAB — HCG, QUANTITATIVE, PREGNANCY: hCG, Beta Chain, Quant, S: <1 m[IU]/mL (ref <5)

## 2021-06-09 ENCOUNTER — Ambulatory Visit (HOSPITAL_COMMUNITY)
Admission: RE | Admit: 2021-06-09 | Discharge: 2021-06-09 | Disposition: A | Discharge status: Home / Self Care | Payer: BC Managed Care – PPO | Attending: Obstetrics & Gynecology | Admitting: Obstetrics & Gynecology

## 2021-06-09 ENCOUNTER — Encounter (HOSPITAL_COMMUNITY)
Admission: RE | Disposition: A | Discharge status: Home / Self Care | Payer: Self-pay | Source: Home / Self Care | Attending: Obstetrics & Gynecology

## 2021-06-09 ENCOUNTER — Encounter (HOSPITAL_COMMUNITY): Payer: Self-pay | Admitting: Obstetrics & Gynecology

## 2021-06-09 ENCOUNTER — Ambulatory Visit (HOSPITAL_COMMUNITY): Payer: BC Managed Care – PPO | Admitting: Certified Registered Nurse Anesthetist

## 2021-06-09 DIAGNOSIS — Z880 Allergy status to penicillin: Secondary | ICD-10-CM | POA: Diagnosis not present

## 2021-06-09 DIAGNOSIS — Z9049 Acquired absence of other specified parts of digestive tract: Secondary | ICD-10-CM | POA: Insufficient documentation

## 2021-06-09 DIAGNOSIS — Z302 Encounter for sterilization: Secondary | ICD-10-CM

## 2021-06-09 DIAGNOSIS — F17290 Nicotine dependence, other tobacco product, uncomplicated: Secondary | ICD-10-CM | POA: Insufficient documentation

## 2021-06-09 DIAGNOSIS — Z90722 Acquired absence of ovaries, bilateral: Secondary | ICD-10-CM | POA: Diagnosis not present

## 2021-06-09 HISTORY — PX: LAPAROSCOPIC BILATERAL SALPINGECTOMY: SHX5889

## 2021-06-09 SURGERY — SALPINGECTOMY, BILATERAL, LAPAROSCOPIC
Anesthesia: General | Site: Abdomen | Laterality: Bilateral

## 2021-06-09 MED ORDER — FENTANYL CITRATE (PF) 100 MCG/2ML IJ SOLN
INTRAMUSCULAR | Status: DC | PRN
  Administered 2021-06-09: 100 ug via INTRAVENOUS
  Administered 2021-06-09: 50 ug via INTRAVENOUS

## 2021-06-09 MED ORDER — BUPIVACAINE LIPOSOME 1.3 % IJ SUSP
20.0000 mL | Freq: Once | INTRAMUSCULAR | Status: DC
  Filled 2021-06-09: qty 20

## 2021-06-09 MED ORDER — DEXAMETHASONE SODIUM PHOSPHATE 10 MG/ML IJ SOLN
INTRAMUSCULAR | Status: DC | PRN
  Administered 2021-06-09: 10 mg via INTRAVENOUS

## 2021-06-09 MED ORDER — ONDANSETRON HCL 4 MG/2ML IJ SOLN
INTRAMUSCULAR | Status: AC
  Filled 2021-06-09: qty 2

## 2021-06-09 MED ORDER — LACTATED RINGERS IV SOLN: INTRAVENOUS | Status: DC

## 2021-06-09 MED ORDER — LIDOCAINE HCL (PF) 2 % IJ SOLN
INTRAMUSCULAR | Status: AC
  Filled 2021-06-09: qty 5

## 2021-06-09 MED ORDER — PROPOFOL 10 MG/ML IV BOLUS
INTRAVENOUS | Status: DC | PRN
  Administered 2021-06-09: 200 mg via INTRAVENOUS

## 2021-06-09 MED ORDER — DEXAMETHASONE SODIUM PHOSPHATE 10 MG/ML IJ SOLN
INTRAMUSCULAR | Status: AC
  Filled 2021-06-09: qty 1

## 2021-06-09 MED ORDER — LIDOCAINE HCL (CARDIAC) PF 100 MG/5ML IV SOSY
PREFILLED_SYRINGE | INTRAVENOUS | Status: DC | PRN
Start: 2021-06-09 — End: 2021-06-09
  Administered 2021-06-09: 60 mg via INTRATRACHEAL

## 2021-06-09 MED ORDER — ROCURONIUM BROMIDE 10 MG/ML (PF) SYRINGE
PREFILLED_SYRINGE | INTRAVENOUS | Status: DC | PRN
  Administered 2021-06-09: 50 mg via INTRAVENOUS

## 2021-06-09 MED ORDER — CLINDAMYCIN PHOSPHATE 900 MG/50ML IV SOLN
900.0000 mg | INTRAVENOUS | Status: AC
  Administered 2021-06-09: 900 mg via INTRAVENOUS
  Filled 2021-06-09: qty 50

## 2021-06-09 MED ORDER — KETOROLAC TROMETHAMINE 30 MG/ML IJ SOLN
30.0000 mg | Freq: Once | INTRAMUSCULAR | Status: AC
  Administered 2021-06-09: 30 mg via INTRAVENOUS
  Filled 2021-06-09: qty 1

## 2021-06-09 MED ORDER — CHLORHEXIDINE GLUCONATE 0.12 % MT SOLN
15.0000 mL | Freq: Once | OROMUCOSAL | Status: AC
  Administered 2021-06-09: 15 mL via OROMUCOSAL

## 2021-06-09 MED ORDER — SODIUM CHLORIDE 0.9 % IR SOLN
Status: DC | PRN
  Administered 2021-06-09: 1000 mL

## 2021-06-09 MED ORDER — POVIDONE-IODINE 10 % EX SWAB
2.0000 "application " | Freq: Once | CUTANEOUS | Status: AC
  Administered 2021-06-09: 2 via TOPICAL

## 2021-06-09 MED ORDER — HYDROCODONE-ACETAMINOPHEN 5-325 MG PO TABS: 1.0000 | ORAL_TABLET | Freq: Four times a day (QID) | ORAL | 0 refills | Status: DC | PRN

## 2021-06-09 MED ORDER — BUPIVACAINE LIPOSOME 1.3 % IJ SUSP
INTRAMUSCULAR | Status: DC | PRN
  Administered 2021-06-09: 20 mL

## 2021-06-09 MED ORDER — ORAL CARE MOUTH RINSE: 15.0000 mL | Freq: Once | OROMUCOSAL | Status: AC

## 2021-06-09 MED ORDER — ONDANSETRON HCL 4 MG/2ML IJ SOLN
INTRAMUSCULAR | Status: DC | PRN
  Administered 2021-06-09: 4 mg via INTRAVENOUS

## 2021-06-09 MED ORDER — FENTANYL CITRATE (PF) 250 MCG/5ML IJ SOLN
INTRAMUSCULAR | Status: AC
  Filled 2021-06-09: qty 5

## 2021-06-09 MED ORDER — KETOROLAC TROMETHAMINE 10 MG PO TABS: 10.0000 mg | ORAL_TABLET | Freq: Three times a day (TID) | ORAL | 0 refills | Status: DC | PRN

## 2021-06-09 MED ORDER — ROCURONIUM BROMIDE 10 MG/ML (PF) SYRINGE
PREFILLED_SYRINGE | INTRAVENOUS | Status: AC
  Filled 2021-06-09: qty 10

## 2021-06-09 MED ORDER — ONDANSETRON 8 MG PO TBDP: 8.0000 mg | ORAL_TABLET | Freq: Three times a day (TID) | ORAL | 0 refills | Status: DC | PRN

## 2021-06-09 MED ORDER — BUPIVACAINE LIPOSOME 1.3 % IJ SUSP
INTRAMUSCULAR | Status: AC
  Filled 2021-06-09: qty 20

## 2021-06-09 MED ORDER — MIDAZOLAM HCL 2 MG/2ML IJ SOLN
INTRAMUSCULAR | Status: AC
  Filled 2021-06-09: qty 2

## 2021-06-09 MED ORDER — PROPOFOL 10 MG/ML IV BOLUS
INTRAVENOUS | Status: AC
  Filled 2021-06-09: qty 20

## 2021-06-09 MED ORDER — GENTAMICIN SULFATE 40 MG/ML IJ SOLN
5.0000 mg/kg | INTRAVENOUS | Status: AC
  Administered 2021-06-09: 350.4 mg via INTRAVENOUS
  Filled 2021-06-09: qty 8.75

## 2021-06-09 MED ORDER — MIDAZOLAM HCL 2 MG/2ML IJ SOLN
INTRAMUSCULAR | Status: DC | PRN
  Administered 2021-06-09: 2 mg via INTRAVENOUS

## 2021-06-09 MED ORDER — SUGAMMADEX SODIUM 500 MG/5ML IV SOLN
INTRAVENOUS | Status: DC | PRN
  Administered 2021-06-09: 200 mg via INTRAVENOUS

## 2021-06-09 SURGICAL SUPPLY — 39 items
ADH SKN CLS APL DERMABOND .7 (GAUZE/BANDAGES/DRESSINGS) ×1
APL SWBSTK 6 STRL LF DISP (MISCELLANEOUS) ×1
APPLICATOR COTTON TIP 6 STRL (MISCELLANEOUS) ×1 IMPLANT
APPLICATOR COTTON TIP 6IN STRL (MISCELLANEOUS) ×2
BAG HAMPER (MISCELLANEOUS) ×2 IMPLANT
BLADE SURG SZ11 CARB STEEL (BLADE) ×2 IMPLANT
CLOTH BEACON ORANGE TIMEOUT ST (SAFETY) ×2 IMPLANT
COVER LIGHT HANDLE STERIS (MISCELLANEOUS) ×4 IMPLANT
DERMABOND ADVANCED (GAUZE/BANDAGES/DRESSINGS) ×1
DERMABOND ADVANCED .7 DNX12 (GAUZE/BANDAGES/DRESSINGS) ×1 IMPLANT
ELECT REM PT RETURN 9FT ADLT (ELECTROSURGICAL) ×2
ELECTRODE REM PT RTRN 9FT ADLT (ELECTROSURGICAL) ×1 IMPLANT
GAUZE 4X4 16PLY ~~LOC~~+RFID DBL (SPONGE) ×4 IMPLANT
GLOVE ECLIPSE 8.0 STRL XLNG CF (GLOVE) ×2 IMPLANT
GLOVE SRG 8 PF TXTR STRL LF DI (GLOVE) ×1 IMPLANT
GLOVE SURG UNDER POLY LF SZ7 (GLOVE) ×6 IMPLANT
GLOVE SURG UNDER POLY LF SZ8 (GLOVE) ×2
GOWN STRL REUS W/TWL LRG LVL3 (GOWN DISPOSABLE) ×2 IMPLANT
GOWN STRL REUS W/TWL XL LVL3 (GOWN DISPOSABLE) ×2 IMPLANT
INST SET LAPROSCOPIC GYN AP (KITS) ×2 IMPLANT
KIT TURNOVER CYSTO (KITS) ×2 IMPLANT
NEEDLE HYPO 18GX1.5 BLUNT FILL (NEEDLE) ×2 IMPLANT
NEEDLE HYPO 21X1.5 SAFETY (NEEDLE) ×2 IMPLANT
NEEDLE INSUFFLATION 14GA 120MM (NEEDLE) ×2 IMPLANT
PACK PERI GYN (CUSTOM PROCEDURE TRAY) ×2 IMPLANT
PAD ARMBOARD 7.5X6 YLW CONV (MISCELLANEOUS) ×2 IMPLANT
SET BASIN LINEN APH (SET/KITS/TRAYS/PACK) ×2 IMPLANT
SET TUBE SMOKE EVAC HIGH FLOW (TUBING) ×2 IMPLANT
SHEARS HARMONIC ACE PLUS 36CM (ENDOMECHANICALS) ×2 IMPLANT
SLEEVE ENDOPATH XCEL 5M (ENDOMECHANICALS) ×2 IMPLANT
SOL ANTI FOG 6CC (MISCELLANEOUS) ×1 IMPLANT
SOLUTION ANTI FOG 6CC (MISCELLANEOUS) ×1
SUT VICRYL 0 UR6 27IN ABS (SUTURE) ×2 IMPLANT
SUT VICRYL AB 3-0 FS1 BRD 27IN (SUTURE) ×4 IMPLANT
SYR 10ML LL (SYRINGE) ×2 IMPLANT
SYR 20ML LL LF (SYRINGE) ×4 IMPLANT
TROCAR ENDO BLADELESS 11MM (ENDOMECHANICALS) ×2 IMPLANT
TROCAR XCEL NON-BLD 5MMX100MML (ENDOMECHANICALS) ×2 IMPLANT
WARMER LAPAROSCOPE (MISCELLANEOUS) ×2 IMPLANT

## 2021-06-09 NOTE — Transfer of Care (Signed)
Immediate Anesthesia Transfer of Care Note  Patient: Emily Hatfield  Procedure(s) Performed: LAPAROSCOPIC BILATERAL SALPINGECTOMY (Bilateral: Abdomen)  Patient Location: PACU  Anesthesia Type:General  Level of Consciousness: awake, alert , oriented and patient cooperative  Airway & Oxygen Therapy: Patient Spontanous Breathing  Post-op Assessment: Report given to RN, Post -op Vital signs reviewed and stable and Patient moving all extremities X 4  Post vital signs: Reviewed and stable  Last Vitals:  Vitals Value Taken Time  BP    Temp    Pulse    Resp    SpO2      Last Pain:  Vitals:   06/09/21 0853  TempSrc: Oral  PainSc: 0-No pain         Complications: No notable events documented.

## 2021-06-09 NOTE — H&P (Signed)
Preoperative History and Physical  Emily Hatfield is a 30 y.o. [phone] with Patient's last menstrual period was 05/14/2021. admitted for a laparoscopic bilateral salpingectomy for permanent sterilization, opts for salpingectomy.    PMH:    Past Medical History:  Diagnosis Date   Anxiety    Medical history non-contributory    Mental disorder    Nausea and vomiting during pregnancy prior to [redacted] weeks gestation 08/18/2014   Pregnant 08/18/2014    PSH:     Past Surgical History:  Procedure Laterality Date   CHOLECYSTECTOMY N/A 07/21/2016   Procedure: LAPAROSCOPIC CHOLECYSTECTOMY;  Surgeon: Vickie Epley, MD;  Location: [facility];  Service: General;  Laterality: N/A;   WISDOM TOOTH EXTRACTION      POb/GynH:      OB History     Gravida  3   Para  2   Term  2   Preterm      AB  1   Living  2      SAB  1   IAB      Ectopic      Multiple  0   Live Births  2           SH:   Social History   Tobacco Use   Smoking status: Some Days    Types: Cigars, E-cigarettes   Smokeless tobacco: Never  Vaping Use   Vaping Use: Some days  Substance Use Topics   Alcohol use: No   Drug use: No    FH:    Family History  Problem Relation Age of Onset   Stroke Paternal Grandmother    Heart disease Maternal Grandmother    Cancer Maternal Grandfather        lung   Hypertension Mother    Diabetes Mother        borderline     Allergies:  Allergies  Allergen Reactions   Penicillins Hives, Itching and Rash    Has patient had a PCN reaction causing immediate rash, facial/tongue/throat swelling, SOB or lightheadedness with hypotension: Yes Has patient had a PCN reaction causing severe rash involving mucus membranes or skin necrosis: Yes Has patient had a PCN reaction that required hospitalization No Has patient had a PCN reaction occurring within the last 10 years: Yes If all of the above answers are "NO", then may proceed with Cephalosporin use.      Medications:       Current Facility-Administered Medications:    bupivacaine liposome (EXPAREL) 1.3 % injection 266 mg, 20 mL, Infiltration, Once, Maryrose Colvin, Mertie Clause, MD   clindamycin (CLEOCIN) IVPB 900 mg, 900 mg, Intravenous, 60 min Pre-Op **AND** gentamicin (GARAMYCIN) 350 mg in dextrose 5 % 100 mL IVPB, 5 mg/kg (Adjusted), Intravenous, 60 min Pre-Op, Florian Buff, MD  Review of Systems:   Review of Systems  Constitutional: Negative for fever, chills, weight loss, malaise/fatigue and diaphoresis.  HENT: Negative for hearing loss, ear pain, nosebleeds, congestion, sore throat, neck pain, tinnitus and ear discharge.   Eyes: Negative for blurred vision, double vision, photophobia, pain, discharge and redness.  Respiratory: Negative for cough, hemoptysis, sputum production, shortness of breath, wheezing and stridor.   Cardiovascular: Negative for chest pain, palpitations, orthopnea, claudication, leg swelling and PND.  Gastrointestinal: Positive for abdominal pain. Negative for heartburn, nausea, vomiting, diarrhea, constipation, blood in stool and melena.  Genitourinary: Negative for dysuria, urgency, frequency, hematuria and flank pain.  Musculoskeletal: Negative for myalgias, back pain, joint pain and falls.  Skin: Negative  for itching and rash.  Neurological: Negative for dizziness, tingling, tremors, sensory change, speech change, focal weakness, seizures, loss of consciousness, weakness and headaches.  Endo/Heme/Allergies: Negative for environmental allergies and polydipsia. Does not bruise/bleed easily.  Psychiatric/Behavioral: Negative for depression, suicidal ideas, hallucinations, memory loss and substance abuse. The patient is not nervous/anxious and does not have insomnia.      PHYSICAL EXAM:  Blood pressure (!) 127/59, pulse (!) 58, temperature 98 F (36.7 C), temperature source Oral, resp. rate 20, last menstrual period 05/14/2021, SpO2 99 %.    Vitals  reviewed. Constitutional: She is oriented to person, place, and time. She appears well-developed and well-nourished.  HENT:  Head: Normocephalic and atraumatic.  Right Ear: External ear normal.  Left Ear: External ear normal.  Nose: Nose normal.  Mouth/Throat: Oropharynx is clear and moist.  Eyes: Conjunctivae and EOM are normal. Pupils are equal, round, and reactive to light. Right eye exhibits no discharge. Left eye exhibits no discharge. No scleral icterus.  Neck: Normal range of motion. Neck supple. No tracheal deviation present. No thyromegaly present.  Cardiovascular: Normal rate, regular rhythm, normal heart sounds and intact distal pulses.  Exam reveals no gallop and no friction rub.   No murmur heard. Respiratory: Effort normal and breath sounds normal. No respiratory distress. She has no wheezes. She has no rales. She exhibits no tenderness.  GI: Soft. Bowel sounds are normal. She exhibits no distension and no mass. There is tenderness. There is no rebound and no guarding.  Genitourinary:       Vulva is normal without lesions Vagina is pink moist without discharge Cervix normal in appearance and pap is normal Uterus is normal size, contour, position, consistency, mobility, non-tender Adnexa is negative with normal sized ovaries by sonogram  Musculoskeletal: Normal range of motion. She exhibits no edema and no tenderness.  Neurological: She is alert and oriented to person, place, and time. She has normal reflexes. She displays normal reflexes. No cranial nerve deficit. She exhibits normal muscle tone. Coordination normal.  Skin: Skin is warm and dry. No rash noted. No erythema. No pallor.  Psychiatric: She has a normal mood and affect. Her behavior is normal. Judgment and thought content normal.    Labs: Results for orders placed or performed during the hospital encounter of 06/04/21 (from the past 336 hour(s))  CBC   Collection Time: 06/04/21  9:35 AM  Result Value Ref Range    WBC 6.7 4.0 - 10.5 K/uL   RBC 4.79 3.87 - 5.11 MIL/uL   Hemoglobin 14.0 12.0 - 15.0 g/dL   HCT 62.8 31.5 - 17.6 %   MCV 92.1 80.0 - 100.0 fL   MCH 29.2 26.0 - 34.0 pg   MCHC 31.7 30.0 - 36.0 g/dL   RDW 16.0 73.7 - 10.6 %   Platelets 260 150 - 400 K/uL   nRBC 0.0 0.0 - 0.2 %  Comprehensive metabolic panel   Collection Time: 06/04/21  9:35 AM  Result Value Ref Range   Sodium 136 135 - 145 mmol/L   Potassium 3.5 3.5 - 5.1 mmol/L   Chloride 103 98 - 111 mmol/L   CO2 27 22 - 32 mmol/L   Glucose, Bld 84 70 - 99 mg/dL   BUN 9 6 - 20 mg/dL   Creatinine, Ser 2.69 0.44 - 1.00 mg/dL   Calcium 8.8 (L) 8.9 - 10.3 mg/dL   Total Protein 8.1 6.5 - 8.1 g/dL   Albumin 4.1 3.5 - 5.0 g/dL   AST  18 15 - 41 U/L   ALT 14 0 - 44 U/L   Alkaline Phosphatase 119 38 - 126 U/L   Total Bilirubin 0.9 0.3 - 1.2 mg/dL   GFR, Estimated >60 >60 mL/min   Anion gap 6 5 - 15  hCG, quantitative, pregnancy   Collection Time: 06/04/21  9:35 AM  Result Value Ref Range   hCG, Beta Chain, Quant, S <1 <5 mIU/mL  Rapid HIV screen (HIV 1/2 Ab+Ag)   Collection Time: 06/04/21  9:35 AM  Result Value Ref Range   HIV-1 P24 Antigen - HIV24 NON REACTIVE NON REACTIVE   HIV 1/2 Antibodies NON REACTIVE NON REACTIVE   Interpretation (HIV Ag Ab)      A non reactive test result means that HIV 1 or HIV 2 antibodies and HIV 1 p24 antigen were not detected in the specimen.  Urinalysis, Routine w reflex microscopic Urine, Clean Catch   Collection Time: 06/04/21  9:35 AM  Result Value Ref Range   Color, Urine YELLOW YELLOW   APPearance CLEAR CLEAR   Specific Gravity, Urine 1.013 1.005 - 1.030   pH 6.0 5.0 - 8.0   Glucose, UA NEGATIVE NEGATIVE mg/dL   Hgb urine dipstick NEGATIVE NEGATIVE   Bilirubin Urine NEGATIVE NEGATIVE   Ketones, ur NEGATIVE NEGATIVE mg/dL   Protein, ur NEGATIVE NEGATIVE mg/dL   Nitrite NEGATIVE NEGATIVE   Leukocytes,Ua NEGATIVE NEGATIVE    EKG: No orders found for this or any previous  visit.  Imaging Studies: No results found.    Assessment: Patient Active Problem List   Diagnosis Date Noted   General counseling and advice on contraceptive management 08/13/2020   Encounter for gynecological examination with Papanicolaou smear of cervix 08/13/2020   Encounter for surveillance of contraceptive pills 06/18/2019   Smoker 05/01/2017   Varicosities of leg 01/06/2015    Plan: Laparoscopic bilateral salpingectomy for permanent sterilization, opted fr salpingectomy due to low failure rate and ovarian cancer prophylaxis  Florian Buff 06/09/2021 10:31 AM

## 2021-06-09 NOTE — Anesthesia Procedure Notes (Signed)
Procedure Name: Intubation Date/Time: 06/09/2021 10:46 AM Performed by: Karna Dupes, CRNA Pre-anesthesia Checklist: Patient identified, Emergency Drugs available, Suction available and Patient being monitored Patient Re-evaluated:Patient Re-evaluated prior to induction Oxygen Delivery Method: Circle system utilized Preoxygenation: Pre-oxygenation with 100% oxygen Induction Type: IV induction Ventilation: Mask ventilation without difficulty Laryngoscope Size: Mac and 3 Grade View: Grade I Tube type: Oral Number of attempts: 1 Airway Equipment and Method: Stylet Placement Confirmation: ETT inserted through vocal cords under direct vision, positive ETCO2 and breath sounds checked- equal and bilateral Secured at: 21 cm Tube secured with: Tape Dental Injury: Teeth and Oropharynx as per pre-operative assessment

## 2021-06-09 NOTE — Anesthesia Postprocedure Evaluation (Signed)
Anesthesia Post Note  Patient: Emily Hatfield  Procedure(s) Performed: LAPAROSCOPIC BILATERAL SALPINGECTOMY (Bilateral: Abdomen)  Patient location during evaluation: Phase II Anesthesia Type: General Level of consciousness: awake Pain management: pain level controlled Vital Signs Assessment: post-procedure vital signs reviewed and stable Respiratory status: spontaneous breathing and respiratory function stable Cardiovascular status: blood pressure returned to baseline and stable Postop Assessment: no headache and no apparent nausea or vomiting Anesthetic complications: no Comments: Late entry   No notable events documented.   Last Vitals:  Vitals:   06/09/21 0853 06/09/21 1137  BP: (!) 127/59 (!) 114/54  Pulse: (!) 58 67  Resp: 20 17  Temp: 36.7 C 36.9 C  SpO2: 99% 94%    Last Pain:  Vitals:   06/09/21 1137  TempSrc:   PainSc: 0-No pain                 Windell Norfolk

## 2021-06-09 NOTE — Op Note (Signed)
Preoperative Diagnosis:  1.  Multiparous female desires permanent sterilization                                          2.  Elects to have bilateral salpingectomy for ovarian cancer prophylaxis  Postoperative Diagnosis:  Same as above  Procedure:  Laparoscopic Bilateral Salpingectomy for the purpose of permanent sterilization  Surgeon:  Rockne Coons MD  Anaesthesia: general  Findings:  Patient had normal pelvic anatomy and no intraperitoneal abnormalities.  Description of Operation:  Patient was taken to the OR and placed into supine position where she underwent general anaesthesia.   She was placed in the dorsal lithotomy position and prepped and draped in the usual sterile fashion.   An incision was made above the umbilicus and dissection taken down to the rectus fascia. A Veres needle was used to insufflate the periotneal cavity. An 11 mm non bladed video laparoscope trocar was then placed under direct visualization without difficulty.   The above noted findings were observed.   Two additional 5 mm non bladed trocars were placed in the right and left lower quadrants under direct visualization without difficulty.   The Harmonic scalpel was employed and a salpingectomy of both the right and left tubes was performed.   The tubes were removed from the peritoneal cavity and sent to pathology.   There was good hemostasis bilaterally.   The fascia, peritoneum and subcutaneous tissue were closed using 0 vicryl.   All 3 skin incisions were closed using 3-0 vicryl in a subcuticular fashion.  Exparel 266 mg 20 cc was injected in the 3 incisional/trocar sites.  The patient was awakened from anaesthesia and taken to the PACU with all counts being correct x 3.   The patient received  cleocin and gentamicin and Toradol 30 mg IV preoperatively.  Lazaro Arms 06/09/2021 11:33 AM

## 2021-06-09 NOTE — Anesthesia Preprocedure Evaluation (Signed)
Anesthesia Evaluation  Patient identified by MRN, date of birth, ID band Patient awake    Reviewed: Allergy & Precautions, H&P , NPO status , Patient's Chart, lab work & pertinent test results, reviewed documented beta blocker date and time   Airway Mallampati: II  TM Distance: >3 FB Neck ROM: full    Dental no notable dental hx.    Pulmonary neg pulmonary ROS, Current Smoker,    Pulmonary exam normal breath sounds clear to auscultation       Cardiovascular Exercise Tolerance: Good negative cardio ROS   Rhythm:regular Rate:Normal     Neuro/Psych PSYCHIATRIC DISORDERS Anxiety negative neurological ROS     GI/Hepatic negative GI ROS, Neg liver ROS,   Endo/Other  negative endocrine ROS  Renal/GU negative Renal ROS  negative genitourinary   Musculoskeletal   Abdominal   Peds  Hematology negative hematology ROS (+)   Anesthesia Other Findings   Reproductive/Obstetrics negative OB ROS                             Anesthesia Physical Anesthesia Plan  ASA: 2  Anesthesia Plan: General and General ETT   Post-op Pain Management:    Induction:   PONV Risk Score and Plan: Ondansetron  Airway Management Planned:   Additional Equipment:   Intra-op Plan:   Post-operative Plan:   Informed Consent: I have reviewed the patients History and Physical, chart, labs and discussed the procedure including the risks, benefits and alternatives for the proposed anesthesia with the patient or authorized representative who has indicated his/her understanding and acceptance.     Dental Advisory Given  Plan Discussed with: CRNA  Anesthesia Plan Comments:         Anesthesia Quick Evaluation

## 2021-06-10 LAB — SURGICAL PATHOLOGY

## 2021-06-11 ENCOUNTER — Encounter (HOSPITAL_COMMUNITY): Payer: Self-pay | Admitting: Obstetrics & Gynecology

## 2021-06-17 ENCOUNTER — Encounter: Payer: Self-pay | Admitting: Obstetrics & Gynecology

## 2021-06-17 ENCOUNTER — Ambulatory Visit (INDEPENDENT_AMBULATORY_CARE_PROVIDER_SITE_OTHER): Payer: BC Managed Care – PPO | Admitting: Obstetrics & Gynecology

## 2021-06-17 ENCOUNTER — Other Ambulatory Visit: Payer: Self-pay

## 2021-06-17 VITALS — BP 123/74 | HR 59 | Ht 66.0 in | Wt 195.0 lb

## 2021-06-17 DIAGNOSIS — R300 Dysuria: Secondary | ICD-10-CM

## 2021-06-17 DIAGNOSIS — Z4889 Encounter for other specified surgical aftercare: Secondary | ICD-10-CM

## 2021-06-17 NOTE — Progress Notes (Signed)
    Postop Visit  Emily Hatfield is a 30 y.o. [phone] female who presents for a postoperative visit, s/p bilateral salpingectomy completed on 06/09/2021.  Today she notes that she notes pelvic pressure like she always has to void.  This has been going on since surgery.  Overall abdominal pain has improved and she is no longer taking oxy, just tylenol. Denies fever or chills.  Tolerating gen diet.  +Flatus, Regular BMs.  Denies irregular bleeding.   Upstream - 06/17/21 1052       Pregnancy Intention Screening   Does the patient want to become pregnant in the next year? No    Does the patient's partner want to become pregnant in the next year? No    Would the patient like to discuss contraceptive options today? No      Contraception Wrap Up   Current Method Female Sterilization    End Method Female Sterilization    Contraception Counseling Provided No            The pregnancy intention screening data noted above was reviewed. Potential methods of contraception were discussed. The patient elected to proceed with Female Sterilization.   The following portions of the patient's history were reviewed and updated as appropriate: allergies, current medications, past family history, past medical history, past social history, and past surgical history.  Review of Systems Pertinent items are noted in HPI.    Objective:  BP 123/74 (BP Location: Right Arm, Patient Position: Sitting, Cuff Size: Normal)   Pulse (!) 59   Ht 5\' 6"  (1.676 m)   Wt 195 lb (88.5 kg)   BMI 31.47 kg/m    Physical Examination:  GENERAL ASSESSMENT: well developed and well nourished SKIN: warm and dry CHEST: normal air exchange, respiratory effort normal with no retractions HEART: regular rate and rhythm ABDOMEN: soft, non-distended, +BS INCISION: with dermabond- healing appropriately EXTREMITY: no calf tenderness, no edmea bilaterally PSYCH: mood and affect appropriate       Assessment:    Postop visit    Plan:   -Meeting milestones appropriately -Ok to return to work next week -Reviewed pelvic rest for another 3-4wks and continue with restricted physical activity for another few wks -Urine collected, plan to r/o underlying infection   , DO Attending Obstetrician & Gynecologist, Myna Hidalgo for Biochemist, clinical, [facility]

## 2021-06-23 LAB — URINE CULTURE: Organism ID, Bacteria: NO GROWTH

## 2021-06-29 ENCOUNTER — Encounter: Payer: Self-pay | Admitting: Obstetrics & Gynecology

## 2022-04-20 DIAGNOSIS — F411 Generalized anxiety disorder: Secondary | ICD-10-CM | POA: Diagnosis not present

## 2022-04-25 DIAGNOSIS — F411 Generalized anxiety disorder: Secondary | ICD-10-CM | POA: Diagnosis not present

## 2022-05-02 DIAGNOSIS — F411 Generalized anxiety disorder: Secondary | ICD-10-CM | POA: Diagnosis not present

## 2022-05-09 DIAGNOSIS — F411 Generalized anxiety disorder: Secondary | ICD-10-CM | POA: Diagnosis not present

## 2022-05-23 DIAGNOSIS — F411 Generalized anxiety disorder: Secondary | ICD-10-CM | POA: Diagnosis not present

## 2022-05-31 DIAGNOSIS — F411 Generalized anxiety disorder: Secondary | ICD-10-CM | POA: Diagnosis not present

## 2022-06-07 DIAGNOSIS — F411 Generalized anxiety disorder: Secondary | ICD-10-CM | POA: Diagnosis not present

## 2022-06-21 DIAGNOSIS — F411 Generalized anxiety disorder: Secondary | ICD-10-CM | POA: Diagnosis not present

## 2022-08-09 DIAGNOSIS — F411 Generalized anxiety disorder: Secondary | ICD-10-CM | POA: Diagnosis not present

## 2022-08-23 DIAGNOSIS — F411 Generalized anxiety disorder: Secondary | ICD-10-CM | POA: Diagnosis not present

## 2022-09-06 DIAGNOSIS — F411 Generalized anxiety disorder: Secondary | ICD-10-CM | POA: Diagnosis not present

## 2022-09-20 DIAGNOSIS — F411 Generalized anxiety disorder: Secondary | ICD-10-CM | POA: Diagnosis not present

## 2022-11-01 DIAGNOSIS — F411 Generalized anxiety disorder: Secondary | ICD-10-CM | POA: Diagnosis not present

## 2022-12-01 DIAGNOSIS — F411 Generalized anxiety disorder: Secondary | ICD-10-CM | POA: Diagnosis not present

## 2023-01-18 DIAGNOSIS — F411 Generalized anxiety disorder: Secondary | ICD-10-CM | POA: Diagnosis not present

## 2023-02-14 DIAGNOSIS — F411 Generalized anxiety disorder: Secondary | ICD-10-CM | POA: Diagnosis not present

## 2023-03-14 DIAGNOSIS — F411 Generalized anxiety disorder: Secondary | ICD-10-CM | POA: Diagnosis not present

## 2023-04-18 DIAGNOSIS — F411 Generalized anxiety disorder: Secondary | ICD-10-CM | POA: Diagnosis not present

## 2023-05-25 DIAGNOSIS — M62830 Muscle spasm of back: Secondary | ICD-10-CM | POA: Diagnosis not present

## 2023-05-31 DIAGNOSIS — F411 Generalized anxiety disorder: Secondary | ICD-10-CM | POA: Diagnosis not present

## 2024-03-06 ENCOUNTER — Encounter: Payer: Self-pay | Admitting: Obstetrics and Gynecology

## 2024-03-06 ENCOUNTER — Ambulatory Visit: Admitting: Obstetrics and Gynecology

## 2024-03-06 ENCOUNTER — Other Ambulatory Visit (HOSPITAL_COMMUNITY)
Admission: RE | Admit: 2024-03-06 | Discharge: 2024-03-06 | Disposition: A | Discharge status: Home / Self Care | Source: Ambulatory Visit | Attending: Obstetrics and Gynecology | Admitting: Obstetrics and Gynecology

## 2024-03-06 VITALS — BP 126/82 | HR 80 | Ht 66.0 in | Wt 190.0 lb

## 2024-03-06 DIAGNOSIS — Z124 Encounter for screening for malignant neoplasm of cervix: Secondary | ICD-10-CM | POA: Diagnosis present

## 2024-03-06 DIAGNOSIS — N912 Amenorrhea, unspecified: Secondary | ICD-10-CM | POA: Insufficient documentation

## 2024-03-06 DIAGNOSIS — N898 Other specified noninflammatory disorders of vagina: Secondary | ICD-10-CM | POA: Insufficient documentation

## 2024-03-06 LAB — POCT URINE PREGNANCY: Preg Test, Ur: NEGATIVE

## 2024-03-06 NOTE — Progress Notes (Signed)
   GYNECOLOGY PROGRESS NOTE  History:  33 y.o. [id number] presents to [facility]. She reports she has not had a period in July. Home pregnancy test negative. She does endorse this has happened before where it did not show up or it was late. She reports being under a lot of stress and that is what happened last time as well. She reports her period was was irregular after stopping bc and getting BTL but has been mostly normal since then.   She reports vaginal irritation and itching, no odor that started today.  Bilateral salpingectomy on 06/09/21  The following portions of the patient's history were reviewed and updated as appropriate: allergies, current medications, past family history, past medical history, past social history, past surgical history and problem list. Last pap smear on 2022 was normal, neg HRHPV.  Health Maintenance Due  Topic Date Due   Hepatitis C Screening  Never done   DTaP/Tdap/Td (1 - Tdap) Never done   Pneumococcal Vaccine 7-66 Years old (1 of 2 - PCV) Never done   Hepatitis B Vaccines (1 of 3 - 19+ 3-dose series) Never done   HPV VACCINES (1 - 3-dose SCDM series) Never done   COVID-19 Vaccine (1 - 2024-25 season) Never done     Review of Systems:  Pertinent items are noted in HPI.   Objective:  Physical Exam Blood pressure 126/82, pulse 80, height 5' 6 (1.676 m), weight 190 lb (86.2 kg), last menstrual period 01/07/2024. VS reviewed, nursing note reviewed,  Constitutional: well developed, well nourished, no distress HEENT: normocephalic Pulm/chest wall: normal effort Breast Exam: deferred Neuro: alert and oriented  Skin: warm, dry Psych: affect normal Pelvic exam: Pelvic: normal appearing vulva with no masses, tenderness or lesions  VAGINA: normal appearing vagina with normal color and discharge, no lesions  CERVIX: normal appearing cervix without discharge or lesions, no CMT  Thin prep pap is done w HR HPV cotesting  Extremities:  No swelling or  varicosities noted   Assessment & Plan:  1. Routine cervical smear (Primary)  - Cytology - PAP( [facility])  2. Amenorrhea Discussed differentials today, will check swab to r/o infectious component UPT neg, has had salpingectomy 2022 Offered counseling services given her stress, she deferred today If she skips next two period follow up and will obtain blood work  - POCT urine pregnancy - Cervicovaginal ancillary only  3. Vaginal irritation  - Cervicovaginal ancillary only    Return in about 1 year (around 03/06/2025) for Emily [place] Nidia Delores, FNP

## 2024-03-07 ENCOUNTER — Ambulatory Visit: Payer: Self-pay | Admitting: Obstetrics and Gynecology

## 2024-03-07 LAB — CERVICOVAGINAL ANCILLARY ONLY
Bacterial Vaginitis (gardnerella): NEGATIVE
Candida Glabrata: NEGATIVE
Candida Vaginitis: POSITIVE — AB
Chlamydia: NEGATIVE
Comment: NEGATIVE
Comment: NEGATIVE
Comment: NEGATIVE
Comment: NEGATIVE
Comment: NEGATIVE
Comment: NORMAL
Neisseria Gonorrhea: NEGATIVE
Trichomonas: NEGATIVE

## 2024-03-07 MED ORDER — FLUCONAZOLE 150 MG PO TABS: 150.0000 mg | ORAL_TABLET | Freq: Once | ORAL | 1 refills | Status: AC

## 2024-03-12 LAB — CYTOLOGY - PAP
Comment: NEGATIVE
Diagnosis: NEGATIVE
High risk HPV: NEGATIVE

## 2024-04-02 ENCOUNTER — Other Ambulatory Visit: Payer: Self-pay | Admitting: Obstetrics and Gynecology

## 2024-04-02 MED ORDER — FLUCONAZOLE 150 MG PO TABS: 150.0000 mg | ORAL_TABLET | Freq: Once | ORAL | 1 refills | Status: AC

## 2024-05-28 ENCOUNTER — Encounter: Payer: Self-pay | Admitting: Obstetrics & Gynecology

## 2024-05-28 ENCOUNTER — Other Ambulatory Visit: Payer: Self-pay | Admitting: Adult Health

## 2024-05-28 MED ORDER — FLUCONAZOLE 150 MG PO TABS: ORAL_TABLET | ORAL | 1 refills | Status: DC

## 2024-05-28 NOTE — Progress Notes (Signed)
Rx sent in for diflucan.   

## 2024-05-30 ENCOUNTER — Encounter: Payer: Self-pay | Admitting: Adult Health

## 2024-05-30 ENCOUNTER — Ambulatory Visit (INDEPENDENT_AMBULATORY_CARE_PROVIDER_SITE_OTHER): Admitting: Adult Health

## 2024-05-30 VITALS — BP 114/69 | HR 60 | Ht 66.0 in | Wt 175.0 lb

## 2024-05-30 DIAGNOSIS — Z3202 Encounter for pregnancy test, result negative: Secondary | ICD-10-CM | POA: Diagnosis not present

## 2024-05-30 DIAGNOSIS — N926 Irregular menstruation, unspecified: Secondary | ICD-10-CM

## 2024-05-30 LAB — POCT URINE PREGNANCY: Preg Test, Ur: NEGATIVE

## 2024-05-30 MED ORDER — FLUCONAZOLE 150 MG PO TABS: ORAL_TABLET | ORAL | 1 refills | Status: AC

## 2024-05-30 NOTE — Progress Notes (Addendum)
  Subjective:     Patient ID: Emily Hatfield, female   DOB: Jun 16, 1991, 33 y.o.   MRN: [id number]  HPI Jerzy is a 33 year old white female, separated, [id number] in complaining of irregular periods,has missed 2, had one 03/27/24 and none since, had missed some this summer too. And she requests refill on diflucan , she took one and accidentally threw one away.     Component Value Date/Time   DIAGPAP  03/06/2024 1228    - Negative for intraepithelial lesion or malignancy (NILM)   DIAGPAP  08/13/2020 0957    - Negative for intraepithelial lesion or malignancy (NILM)   DIAGPAP  05/01/2017 0000    NEGATIVE FOR INTRAEPITHELIAL LESIONS OR MALIGNANCY.   HPVHIGH Negative 03/06/2024 1228   HPVHIGH Negative 08/13/2020 0957   ADEQPAP  03/06/2024 1228    Satisfactory for evaluation; transformation zone component PRESENT.   ADEQPAP  08/13/2020 0957    Satisfactory for evaluation; transformation zone component PRESENT.   ADEQPAP  05/01/2017 0000    Satisfactory for evaluation  endocervical/transformation zone component PRESENT.     Review of Systems Has missed 2 periods, periods irregular, missed some this summer too Has had yeast infection recently Reviewed past medical,surgical, social and family history. Reviewed medications and allergies.     Objective:   Physical Exam BP 114/69 (BP Location: Right Arm, Patient Position: Sitting, Cuff Size: Normal)   Pulse 60   Ht 5' 6 (1.676 m)   Wt 175 lb (79.4 kg)   LMP 03/27/2024 (Approximate)   BMI 28.25 kg/m  UPT is negative  Skin warm and dry.  Lungs: clear to ausculation bilaterally. Cardiovascular: regular rate and rhythm.    Fall risk is low  Upstream - 05/30/24 1519       Pregnancy Intention Screening   Does the patient want to become pregnant in the next year? No    Does the patient's partner want to become pregnant in the next year? No    Would the patient like to discuss contraceptive options today? No      Contraception Wrap Up    Current Method Female Sterilization    End Method Female Sterilization    Contraception Counseling Provided No          Assessment:     1. Negative pregnancy test - POCT urine pregnancy  2. Irregular periods (Primary) Periods are irregular, had period in August after missing some this summer and has not han one since Will give it another month to see if starts, if not will check UPT and rx provera 10 mg 1 daily for 10 days to see if gets withdrawal bleeding She is aware that needs to have period every 3 months, could cycle with provera if needed Return in about 6 weeks to see if period or not in November, if has one can call and cancel  Could also check labs   3. Missed periods Has missed 2 periods     Plan:    Refilled diflucan  at her request Meds ordered this encounter  Medications   fluconazole  (DIFLUCAN ) 150 MG tablet    Sig: Take 1 now and can repeat in 3 days if needed    Dispense:  2 tablet    Refill:  1    Supervising Provider:   JAYNE MINDER H [[id number]]    Follow up in 6 weeks to see if has period or not

## 2024-07-11 ENCOUNTER — Ambulatory Visit: Admitting: Adult Health
